# Patient Record
Sex: Female | Born: 1965 | Race: White | Hispanic: No | Marital: Married | Smoking: Never smoker
Health system: Southern US, Community
[De-identification: ages and names within clinical notes are randomized; demographics above are authoritative.]

## PROBLEM LIST (undated history)

## (undated) DIAGNOSIS — M47812 Spondylosis without myelopathy or radiculopathy, cervical region: Secondary | ICD-10-CM

## (undated) DIAGNOSIS — Z7982 Long term (current) use of aspirin: Secondary | ICD-10-CM

## (undated) DIAGNOSIS — E119 Type 2 diabetes mellitus without complications: Secondary | ICD-10-CM

## (undated) DIAGNOSIS — G5600 Carpal tunnel syndrome, unspecified upper limb: Secondary | ICD-10-CM

## (undated) DIAGNOSIS — Z79899 Other long term (current) drug therapy: Secondary | ICD-10-CM

## (undated) DIAGNOSIS — G43909 Migraine, unspecified, not intractable, without status migrainosus: Secondary | ICD-10-CM

## (undated) DIAGNOSIS — N950 Postmenopausal bleeding: Secondary | ICD-10-CM

## (undated) DIAGNOSIS — I2510 Atherosclerotic heart disease of native coronary artery without angina pectoris: Secondary | ICD-10-CM

## (undated) DIAGNOSIS — R42 Dizziness and giddiness: Secondary | ICD-10-CM

## (undated) DIAGNOSIS — D6851 Activated protein C resistance: Secondary | ICD-10-CM

## (undated) DIAGNOSIS — K219 Gastro-esophageal reflux disease without esophagitis: Secondary | ICD-10-CM

## (undated) DIAGNOSIS — I10 Essential (primary) hypertension: Secondary | ICD-10-CM

## (undated) HISTORY — PX: CHOLECYSTECTOMY: SHX55

## (undated) HISTORY — PX: CARPAL TUNNEL RELEASE: SHX101

## (undated) HISTORY — PX: ABDOMINAL SURGERY: SHX537

## (undated) HISTORY — DX: Activated protein C resistance: D68.51

---

## 2009-02-02 ENCOUNTER — Emergency Department: Payer: Self-pay | Admitting: Emergency Medicine

## 2009-05-13 ENCOUNTER — Ambulatory Visit: Payer: Self-pay | Admitting: Internal Medicine

## 2009-05-21 ENCOUNTER — Ambulatory Visit: Payer: Self-pay | Admitting: Internal Medicine

## 2009-12-03 ENCOUNTER — Ambulatory Visit: Payer: Self-pay | Admitting: Gastroenterology

## 2010-01-29 ENCOUNTER — Ambulatory Visit: Payer: Self-pay | Admitting: Gastroenterology

## 2010-02-04 ENCOUNTER — Ambulatory Visit: Payer: Self-pay | Admitting: Gastroenterology

## 2010-02-28 ENCOUNTER — Ambulatory Visit: Payer: Self-pay | Admitting: Surgery

## 2010-03-06 ENCOUNTER — Ambulatory Visit: Payer: Self-pay | Admitting: Surgery

## 2010-07-29 ENCOUNTER — Ambulatory Visit: Payer: Self-pay | Admitting: Internal Medicine

## 2010-08-24 ENCOUNTER — Ambulatory Visit: Payer: Self-pay | Admitting: Internal Medicine

## 2010-09-24 ENCOUNTER — Ambulatory Visit: Payer: Self-pay | Admitting: Internal Medicine

## 2011-05-13 ENCOUNTER — Ambulatory Visit: Payer: Self-pay | Admitting: Otolaryngology

## 2012-01-15 IMAGING — US ABDOMEN ULTRASOUND LIMITED
1 series · 17 of 25 positions shown · non-contrast
Comparison: none

REASON FOR EXAM: Abd Pain
COMMENTS:

[Series 1: abdomen ultrasound limited · 17 of 62 slices shown]
[im 1/62]
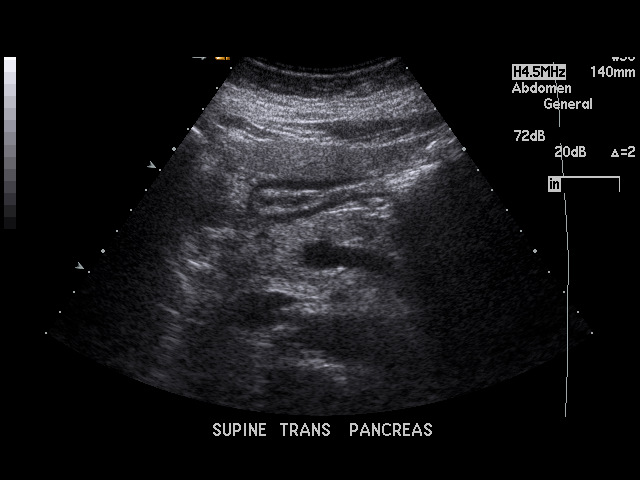
[im 6/62]
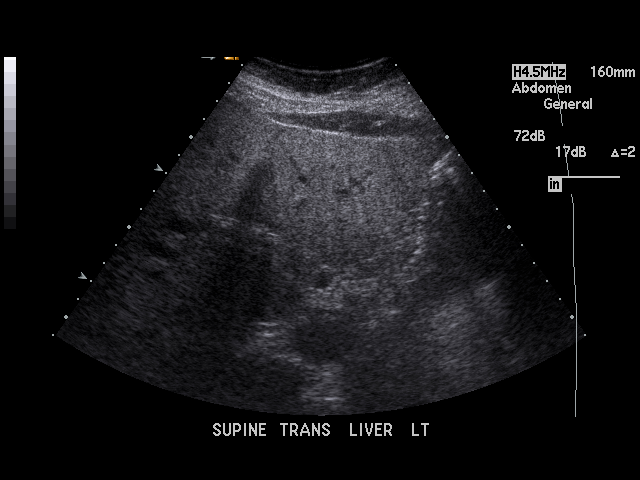
[im 8/62]
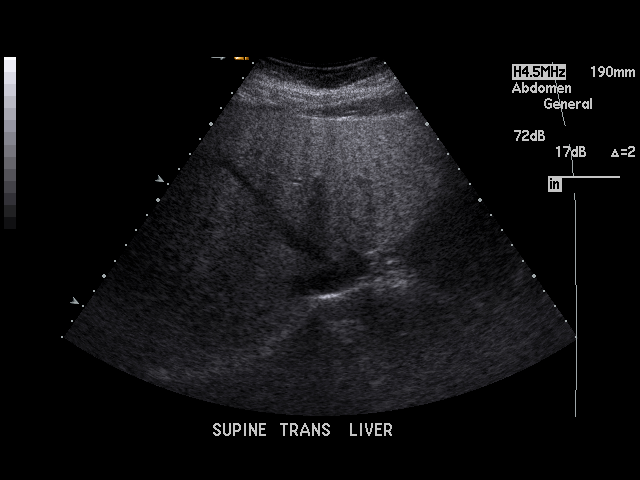
[im 13/62]
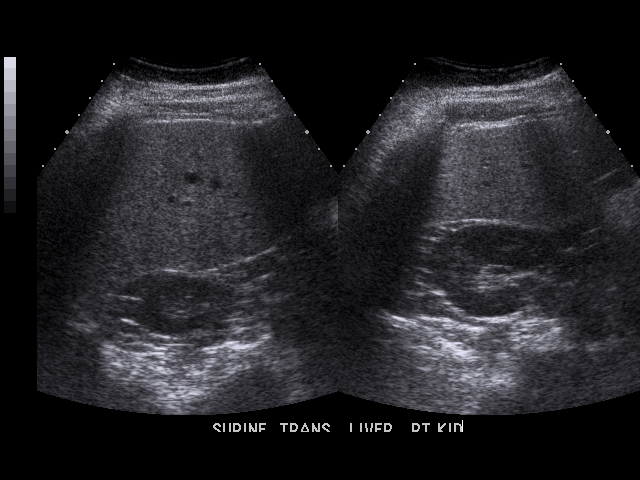
[im 16/62]
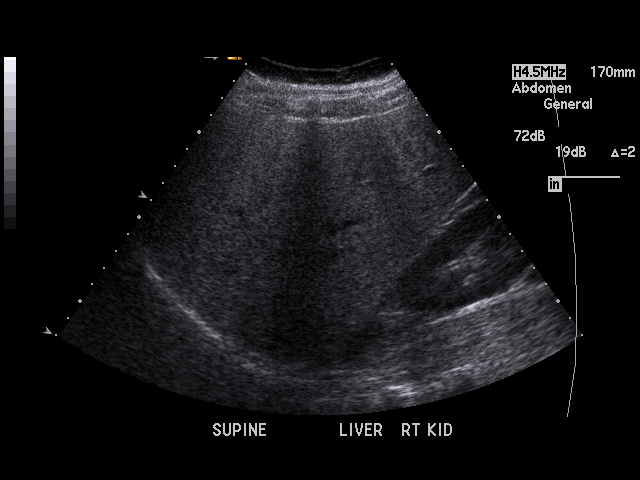
[im 21/62]
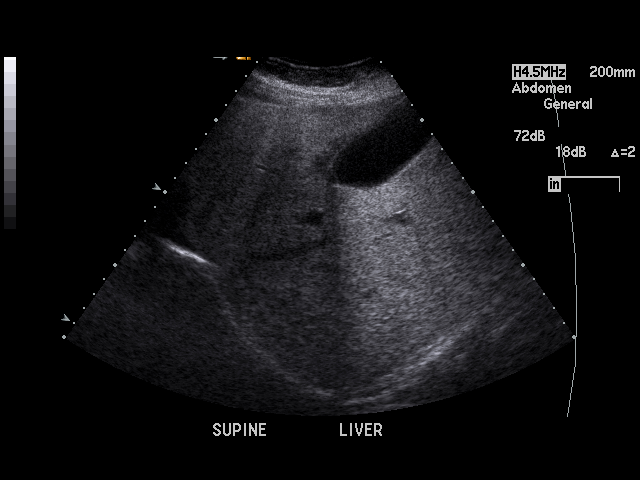
[im 23/62]
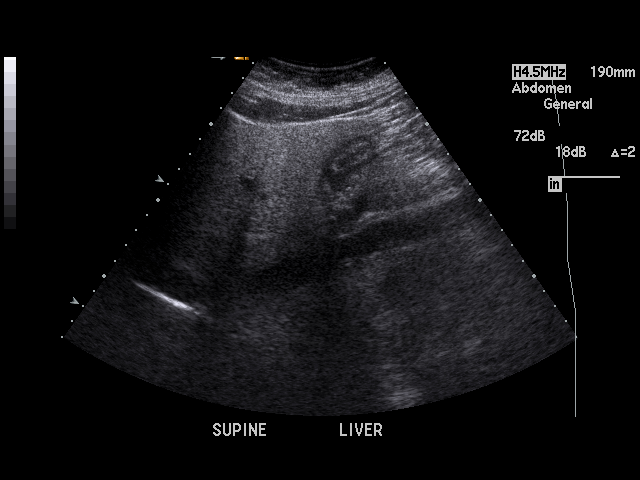
[im 28/62]
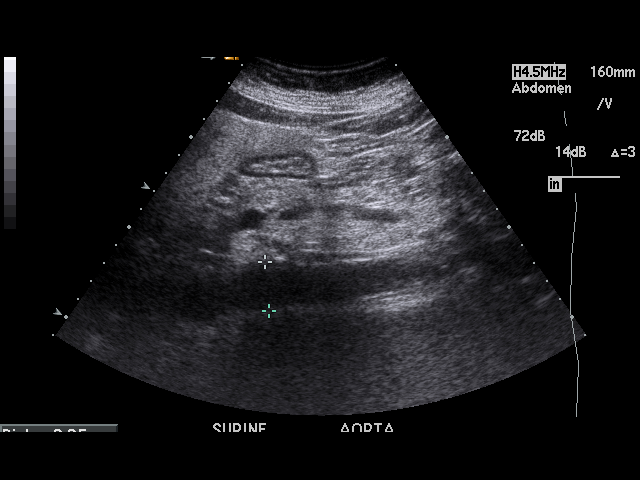
[im 31/62]
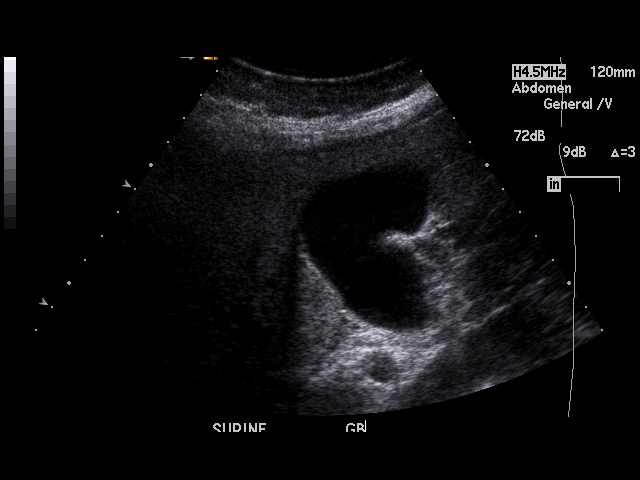
[im 34/62]
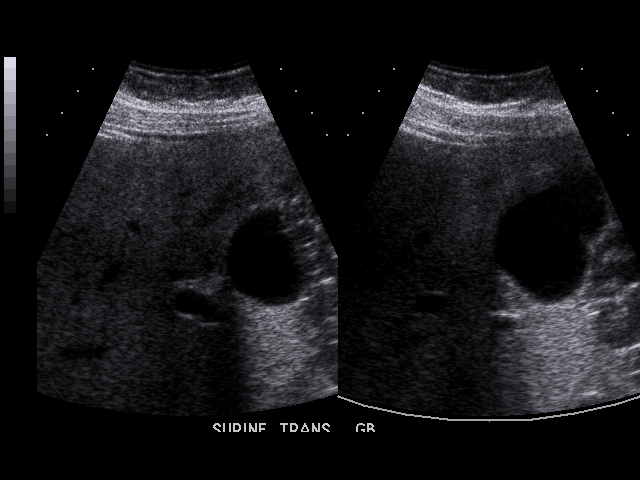
[im 39/62]
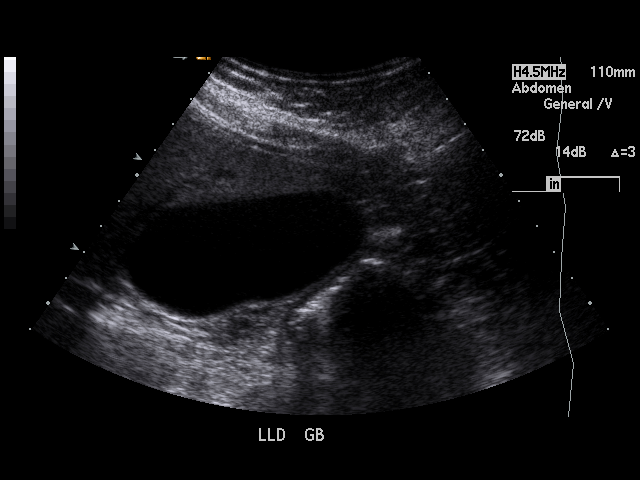
[im 41/62]
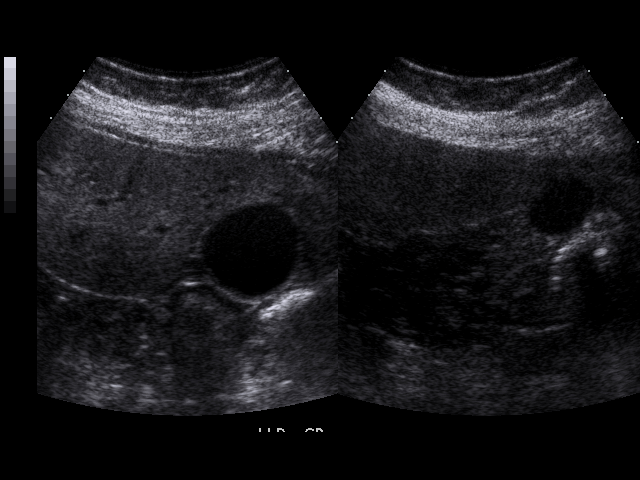
[im 46/62]
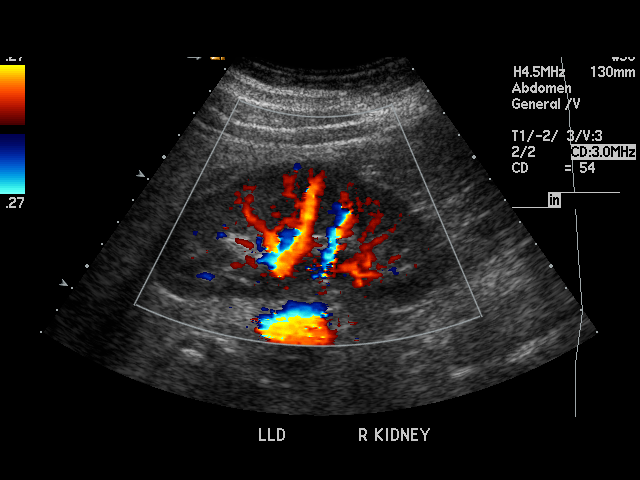
[im 49/62]
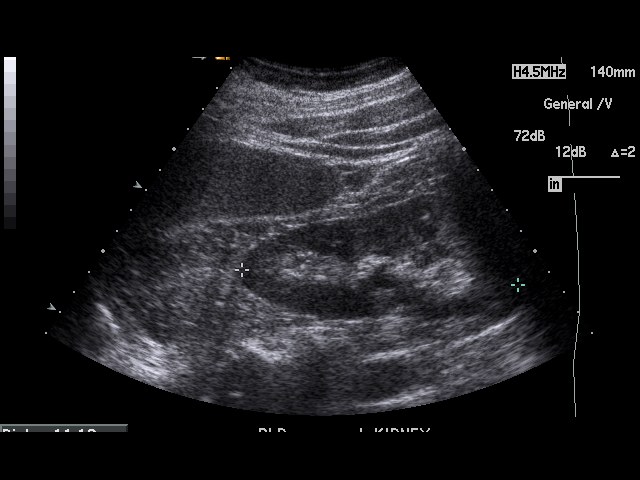
[im 54/62]
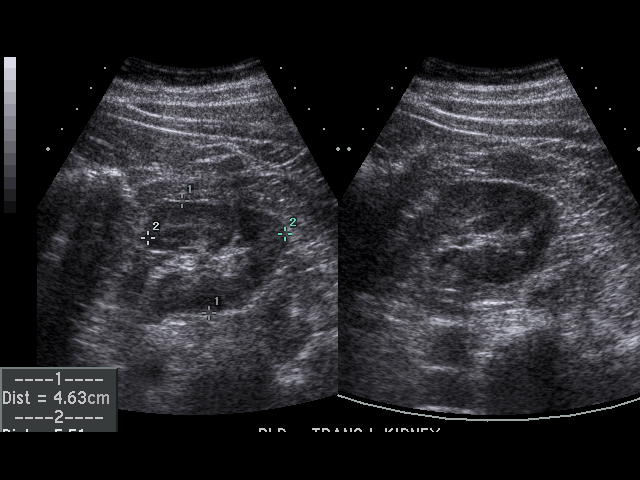
[im 56/62]
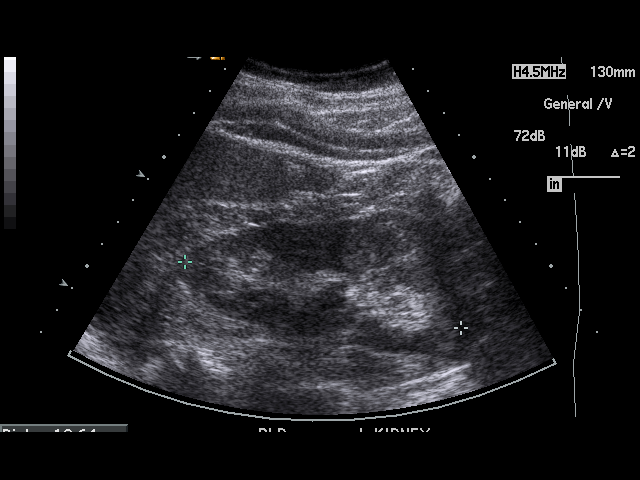
[im 62/62]
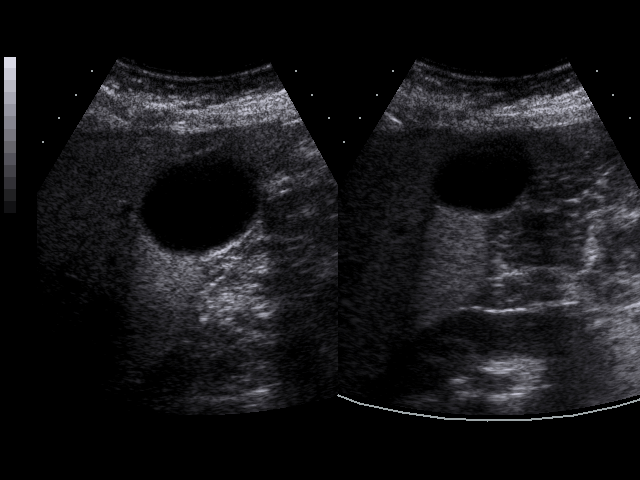

[17 of 25 positions shown; findings below may reference images not displayed]

PROCEDURE:     DONE - DONE ABDOMEN UPPER GENERAL  - January 29, 2010  [DATE]

RESULT:     The liver is hyperechogenic consistent with fatty infiltration.
No focal hepatic mass lesions are seen. The spleen size is within normal
limits. The pancreatic tail is obscured but the head and body of the
pancreas are visualized and are normal in appearance. The abdominal aorta
and inferior vena cava show no significant abnormalities. No gallstones are
seen. There is no thickening of the gallbladder wall. The common bile duct
measures 3.9 mm in diameter which is within normal limits. The kidneys show
no hydronephrosis. Sagittally, the right kidney measures 10.92 cm and the
left measures 11.12 cm. No ascites is seen.
IMPRESSION: 1.  Probable fatty infiltration of the liver.
2.  No gallstones or other acute changes identified.

## 2013-04-28 IMAGING — CT CT PARANASAL SINUSES W/O CM
1 series · 15 of 30 positions shown, 19 images · non-contrast
Comparison: none

REASON FOR EXAM: chronic pansinusitis
COMMENTS:

[Series 2: axial_supine · axial · 0.31mm/px · z∈[+655,+777]mm · 15 of 67 slices shown, 19 images]
[im 3/67  brain]
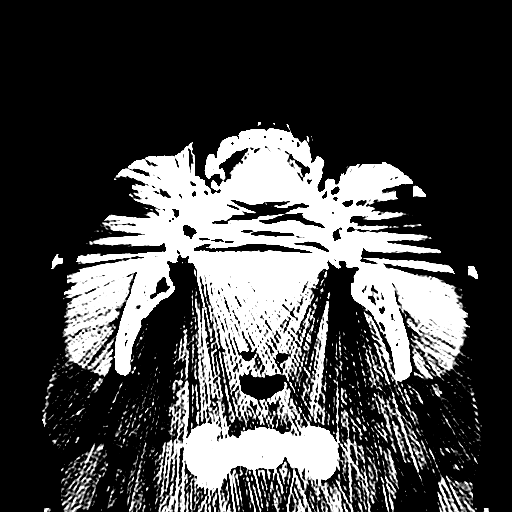
[im 3/67  bone]
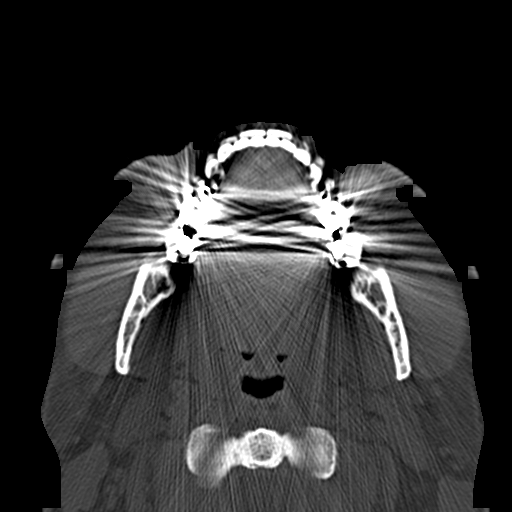
[im 7/67  bone]
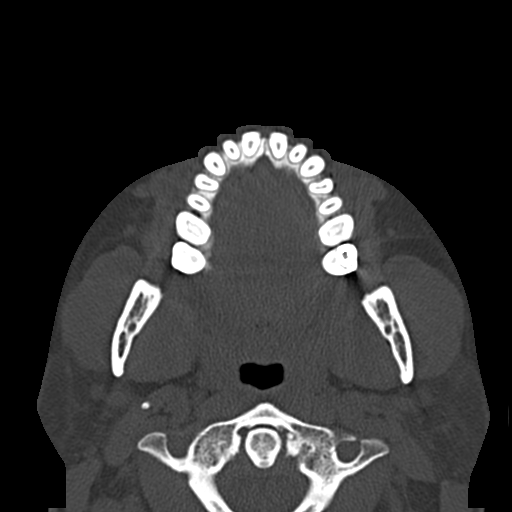
[im 12/67  bone]
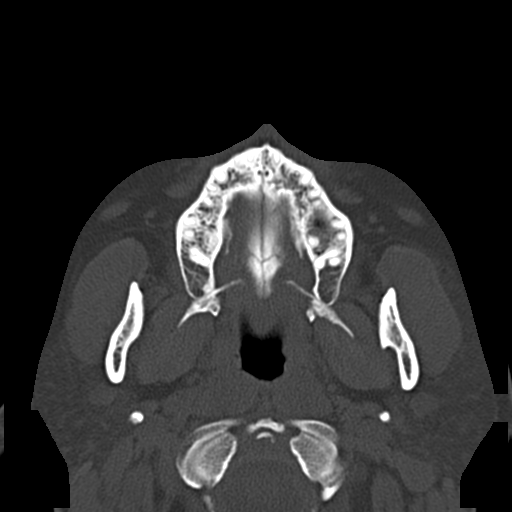
[im 16/67  bone]
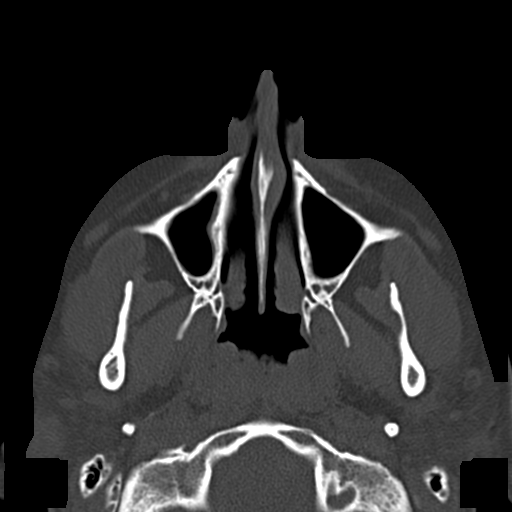
[im 21/67  brain]
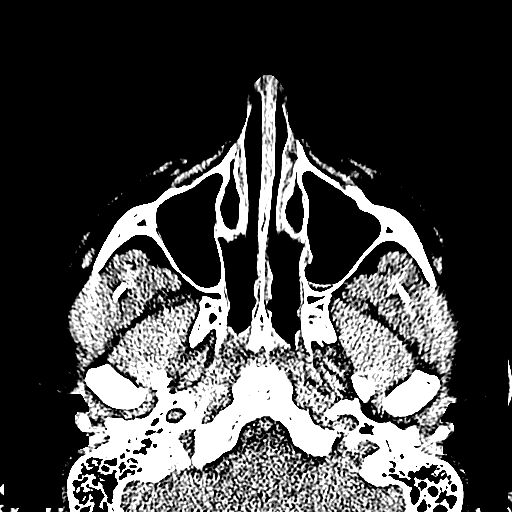
[im 21/67  bone]
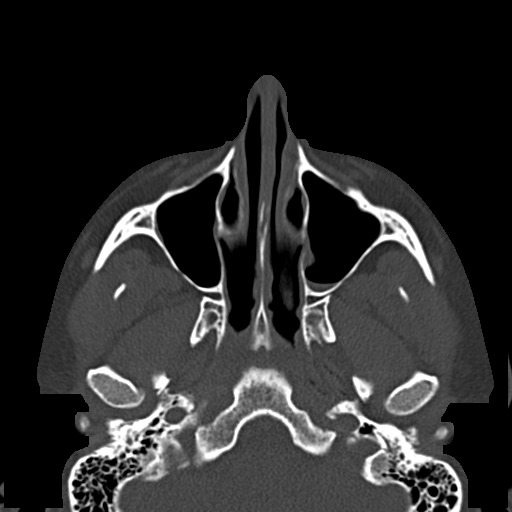
[im 26/67  bone]
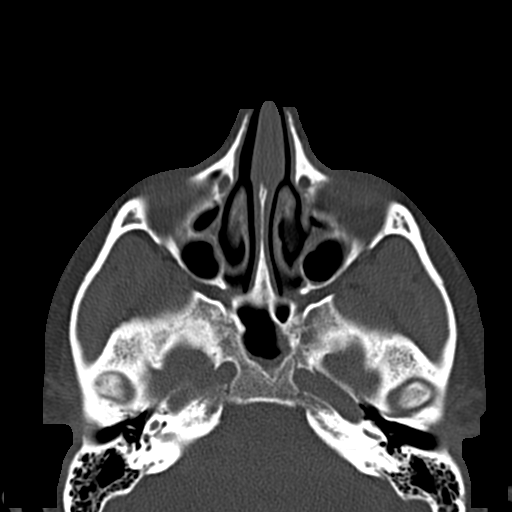
[im 30/67  bone]
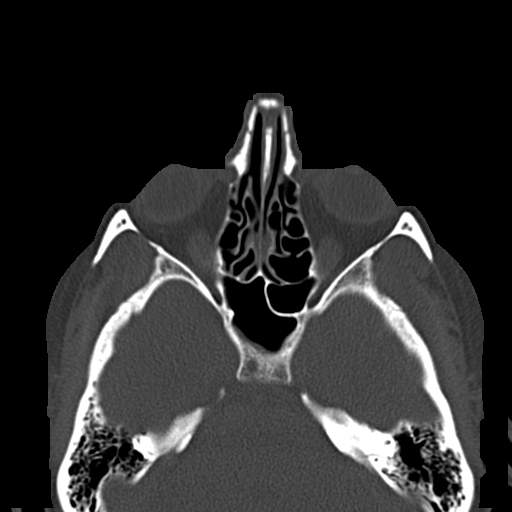
[im 35/67  bone]
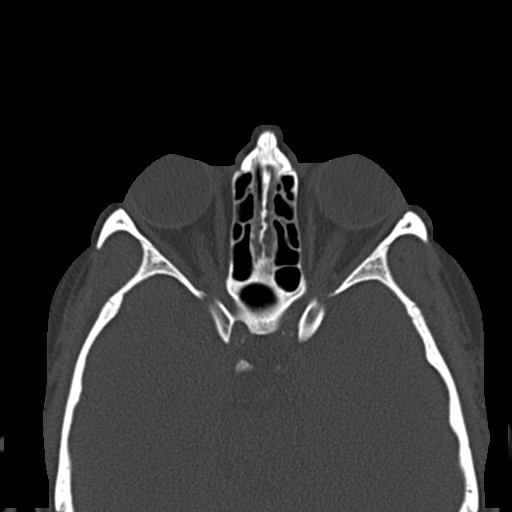
[im 37/67  brain]
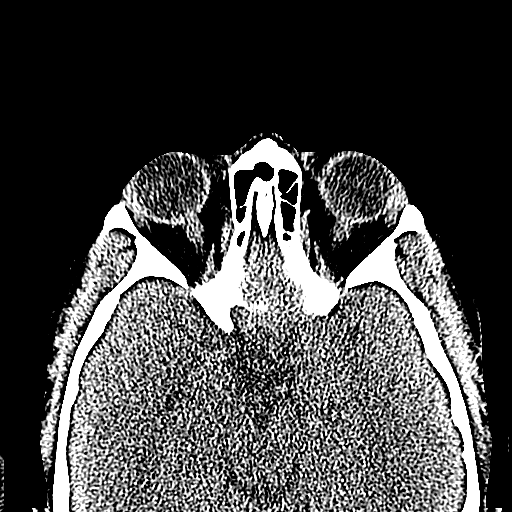
[im 37/67  bone]
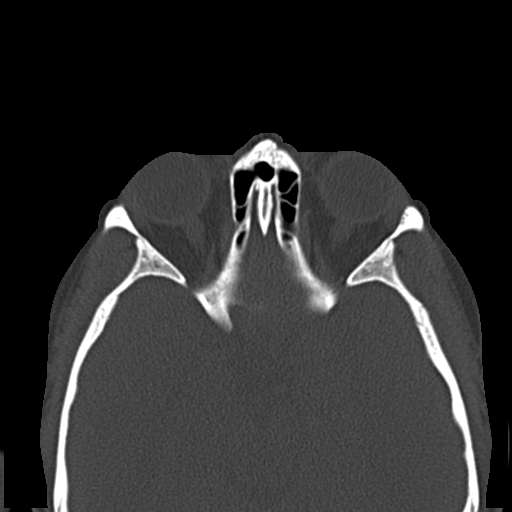
[im 41/67  bone]
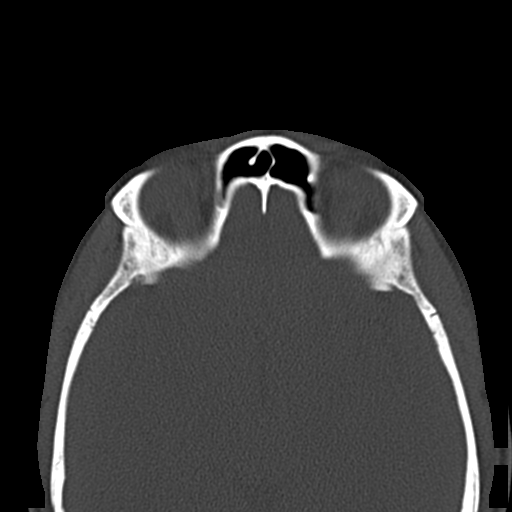
[im 46/67  bone]
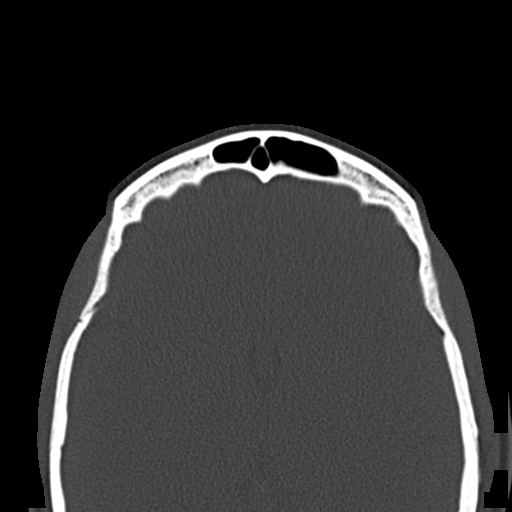
[im 51/67  bone]
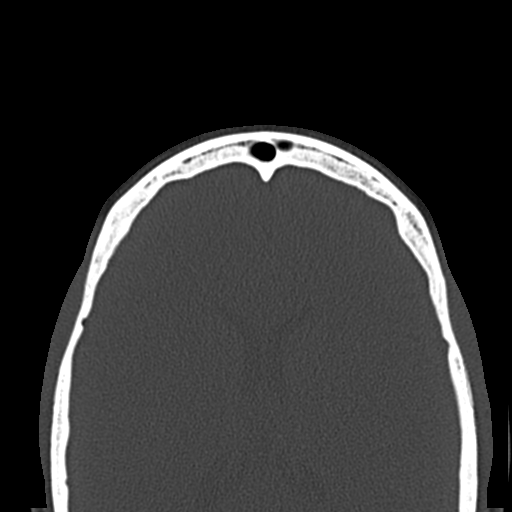
[im 55/67  brain]
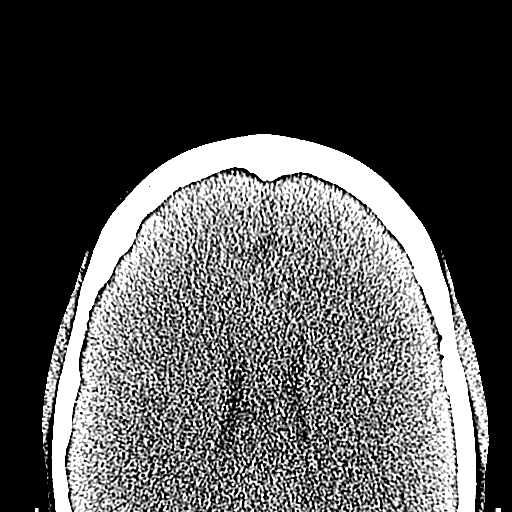
[im 55/67  bone]
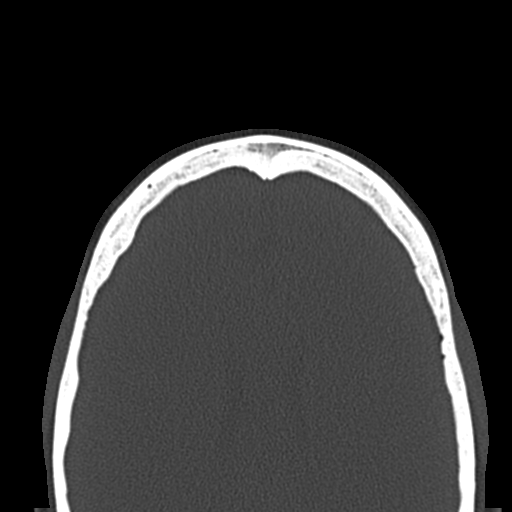
[im 60/67  bone]
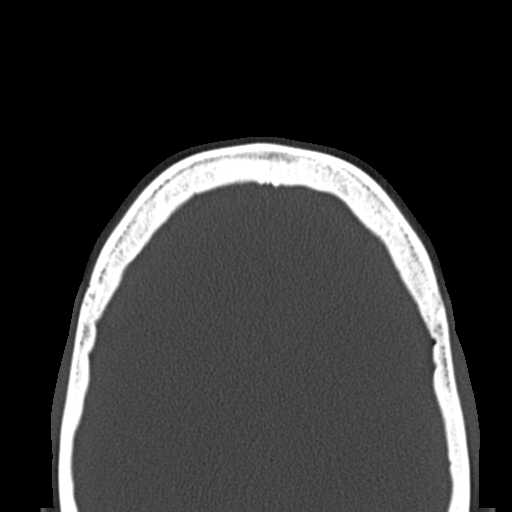
[im 64/67  bone]
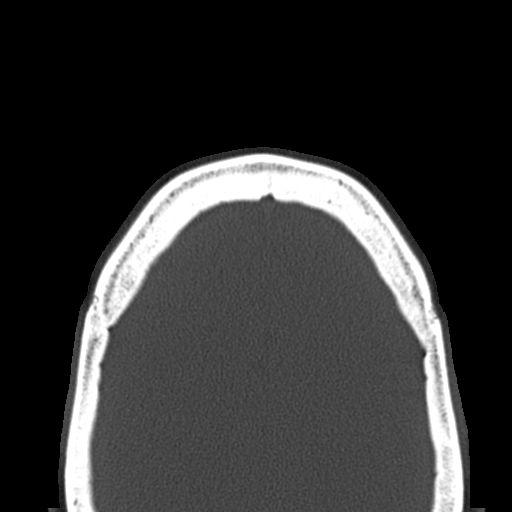

[15 of 30 positions shown; findings below may reference images not displayed]

PROCEDURE:     QUEENA - QUEENA SINUSES WITHOUT CONTRAST  - May 13, 2011  [DATE]

RESULT:     CT through the sinuses is reconstructed at bone window settings
in 2.0 mm slice thickness in the axial and coronal planes. There is no
previous CT for comparison.

The nasal septum approximates midline. The turbinates appear unremarkable.
There is no significant sinus opacification evident. No bony destruction is
appreciated. The orbits appear grossly normal. Minimal mucosal thickening is
seen within the left maxillary sinus possibly with a small mucous retention
cyst or polypoid lesion measuring 5.3 mm anterior to posterior across the
base on axial reconstruction 20.  A minimal amount of fluid may be present
posteriorly in the left maxillary sinus. Mucosal thickening versus minimal
fluid is noted in the right sphenoid sinus. There is partial opacification
posteriorly in the ethmoid air cells. The frontal sinuses are clear.
IMPRESSION: Minimal areas of fluid versus mucosal thickening in the left maxillary and
right sphenoid sinus with minimal opacification in the ethmoid air cells
posteriorly. Possible small polyp or mucous retention cyst along the medial
left maxillary wall.

## 2013-07-26 DIAGNOSIS — E1165 Type 2 diabetes mellitus with hyperglycemia: Secondary | ICD-10-CM | POA: Insufficient documentation

## 2013-07-26 DIAGNOSIS — J309 Allergic rhinitis, unspecified: Secondary | ICD-10-CM | POA: Insufficient documentation

## 2013-07-26 DIAGNOSIS — I10 Essential (primary) hypertension: Secondary | ICD-10-CM | POA: Insufficient documentation

## 2013-08-15 DIAGNOSIS — D682 Hereditary deficiency of other clotting factors: Secondary | ICD-10-CM | POA: Insufficient documentation

## 2013-09-07 ENCOUNTER — Ambulatory Visit: Payer: Self-pay | Admitting: Family Medicine

## 2013-11-23 DIAGNOSIS — F418 Other specified anxiety disorders: Secondary | ICD-10-CM | POA: Insufficient documentation

## 2014-08-31 ENCOUNTER — Emergency Department
Admission: EM | Admit: 2014-08-31 | Discharge: 2014-08-31 | Disposition: A | Discharge status: Home / Self Care | Payer: Self-pay | Attending: Emergency Medicine | Admitting: Emergency Medicine

## 2014-08-31 ENCOUNTER — Encounter: Payer: Self-pay | Admitting: Emergency Medicine

## 2014-08-31 DIAGNOSIS — I10 Essential (primary) hypertension: Secondary | ICD-10-CM | POA: Insufficient documentation

## 2014-08-31 DIAGNOSIS — E119 Type 2 diabetes mellitus without complications: Secondary | ICD-10-CM | POA: Insufficient documentation

## 2014-08-31 DIAGNOSIS — Z880 Allergy status to penicillin: Secondary | ICD-10-CM | POA: Insufficient documentation

## 2014-08-31 DIAGNOSIS — K047 Periapical abscess without sinus: Secondary | ICD-10-CM | POA: Insufficient documentation

## 2014-08-31 HISTORY — DX: Type 2 diabetes mellitus without complications: E11.9

## 2014-08-31 HISTORY — DX: Essential (primary) hypertension: I10

## 2014-08-31 MED ORDER — CLINDAMYCIN HCL 150 MG PO CAPS: 300.0000 mg | ORAL_CAPSULE | Freq: Three times a day (TID) | ORAL | Status: DC

## 2014-08-31 MED ORDER — KETOROLAC TROMETHAMINE 10 MG PO TABS: 10.0000 mg | ORAL_TABLET | Freq: Four times a day (QID) | ORAL | Status: DC | PRN

## 2014-08-31 NOTE — Discharge Instructions (Signed)
Dental Abscess °A dental abscess is a collection of infected fluid (pus) from a bacterial infection in the inner part of the tooth (pulp). It usually occurs at the end of the tooth's root.  °CAUSES  °· Severe tooth decay. °· Trauma to the tooth that allows bacteria to enter into the pulp, such as a broken or chipped tooth. °SYMPTOMS  °· Severe pain in and around the infected tooth. °· Swelling and redness around the abscessed tooth or in the mouth or face. °· Tenderness. °· Pus drainage. °· Bad breath. °· Bitter taste in the mouth. °· Difficulty swallowing. °· Difficulty opening the mouth. °· Nausea. °· Vomiting. °· Chills. °· Swollen neck glands. °DIAGNOSIS  °· A medical and dental history will be taken. °· An examination will be performed by tapping on the abscessed tooth. °· X-rays may be taken of the tooth to identify the abscess. °TREATMENT °The goal of treatment is to eliminate the infection. You may be prescribed antibiotic medicine to stop the infection from spreading. A root canal may be performed to save the tooth. If the tooth cannot be saved, it may be pulled (extracted) and the abscess may be drained.  °HOME CARE INSTRUCTIONS °· Only take over-the-counter or prescription medicines for pain, fever, or discomfort as directed by your caregiver. °· Rinse your mouth (gargle) often with salt water (¼ tsp salt in 8 oz [250 ml] of warm water) to relieve pain or swelling. °· Do not drive after taking pain medicine (narcotics). °· Do not apply heat to the outside of your face. °· Return to your dentist for further treatment as directed. °SEEK MEDICAL CARE IF: °· Your pain is not helped by medicine. °· Your pain is getting worse instead of better. °SEEK IMMEDIATE MEDICAL CARE IF: °· You have a fever or persistent symptoms for more than 2-3 days. °· You have a fever and your symptoms suddenly get worse. °· You have chills or a very bad headache. °· You have problems breathing or swallowing. °· You have trouble  opening your mouth. °· You have swelling in the neck or around the eye. °Document Released: 02/09/2005 Document Revised: 11/04/2011 Document Reviewed: 05/20/2010 °ExitCare® Patient Information ©2015 [employer]. This information is not intended to replace advice given to you by your health care provider. Make sure you discuss any questions you have with your health care provider. ° ° °OPTIONS FOR DENTAL FOLLOW UP CARE ° °[place] Department of Health and Human Services - Local Safety Net Dental Clinics °http://www.ncdhhs.gov/dph/oralhealth/services/safetynetclinics.htm °  °[facility] (336-562-3123) ° °[facility] (919-933-9087) ° °[facility] (919-663-1744 ext 237) ° °[facility] (336-570-6415) ° °[facility] (919-968-2025) °This clinic caters to the indigent population and is on a lottery system. °Location: °[facility], [address], [place] °Clinic Hours: °Wednesdays from 6pm - 9pm, patients seen by a lottery system. °For dates, call or go to www.med.[facility].edu/[facility]/patients/Dental-[facility] °Services: °Cleanings, fillings and simple extractions. °Payment Options: °DENTAL WORK IS FREE OF CHARGE. Bring proof of income or support. °Best way to get seen: °Arrive at 5:15 pm - this is a lottery, NOT first come/first serve, so arriving earlier will not increase your chances of being seen. °  °  °[facility] °919-537-3737 °Select option 1 for emergencies °  °Location: °[facility], [address], [place] °Clinic Hours: °No walk-ins accepted - call the day before to schedule an appointment. °Check in times   are 9:30 am and 1:30 pm. °Services: °Simple extractions, temporary fillings, pulpectomy/pulp debridement, uncomplicated abscess drainage. °Payment Options: °PAYMENT IS DUE AT THE TIME OF SERVICE.  Fee is usually $100-200, additional surgical procedures (e.g. abscess drainage) may  be extra. °Cash, checks, Visa/MasterCard accepted.  Can file Medicaid if patient is covered for dental - patient should call case worker to check. °No discount for [facility] patients. °Best way to get seen: °MUST call the day before and get onto the schedule. Can usually be seen the next 1-2 days. No walk-ins accepted. °  °  °[facility] °919-933-9087 °  °Location: °[facility], [address], [place] °Clinic Hours: °M, W, Th, F 8am or 1:30pm, Tues 9a or 1:30 - first come/first served. °Services: °Simple extractions, temporary fillings, uncomplicated abscess drainage.  You do not need to be an [place] resident. °Payment Options: °PAYMENT IS DUE AT THE TIME OF SERVICE. °Dental insurance, otherwise sliding scale - bring proof of income or support. °Depending on income and treatment needed, cost is usually $50-200. °Best way to get seen: °Arrive early as it is first come/first served. °  °  °[facility] °919-542-1641 °  °Location: °[address] °Clinic Hours: °Mon-Thu 8a-5p °Services: °Most basic dental services including extractions and fillings. °Payment Options: °PAYMENT IS DUE AT THE TIME OF SERVICE. °Sliding scale, up to 50% off - bring proof if income or support. °Medicaid with dental option accepted. °Best way to get seen: °Call to schedule an appointment, can usually be seen within 2 weeks OR they will try to see walk-ins - show up at 8a or 2p (you may have to wait). °  °  °[facility] °919-245-2435 °[place] RESIDENTS ONLY °  °Location: °[facility], [address], [place], [place] [address] °Clinic Hours: By appointment only. °Monday - Thursday 8am-5pm, Friday 8am-12pm °Services: Cleanings, fillings, extractions. °Payment Options: °PAYMENT IS DUE AT THE TIME OF SERVICE. °Cash, Visa or MasterCard. Sliding scale - $30 minimum per service. °Best way to get seen: °Come in to  office, complete packet and make an appointment - need proof of income °or support monies for each household member and proof of [place] residence. °Usually takes about a month to get in. °  °  °[facility] °[phone] °  °Location: °[address] [place] °Clinic Hours: Walk-in Urgent Care Dental Services are offered Monday-Friday mornings only. °The numbers of emergencies accepted daily is limited to the number of °providers available. °Maximum 15 - Mondays, Wednesdays & Thursdays °Maximum 10 - Tuesdays & Fridays °Services: °You do not need to be a [place] resident to be seen for a dental emergency. °Emergencies are defined as pain, swelling, abnormal bleeding, or dental trauma. Walkins will receive x-rays if needed. °NOTE: Dental cleaning is not an emergency. °Payment Options: °PAYMENT IS DUE AT THE TIME OF SERVICE. °Minimum co-pay is $40.00 for uninsured patients. °Minimum co-pay is $3.00 for Medicaid with dental coverage. °Dental Insurance is accepted and must be presented at time of visit. °Medicare does not cover dental. °Forms of payment: Cash, credit card, checks. °Best way to get seen: °If not previously registered with the clinic, walk-in dental registration begins at 7:15 am and is on a first come/first serve basis. °If previously registered with the clinic, call to make an appointment. °  °  °[facility] °919-776-4359 °[place] RESIDENTS ONLY °  °Location: °[address],   [place], [place] °Clinic Hours: °Mon-Thu 10a-2p °Services: Extractions only! °Payment Options: °FREE (donations accepted) - bring proof of income or support °Best way to get seen: °Call and schedule an appointment OR come at 8am on the 1st Monday of every month (except for holidays) when it is first come/first served. °  °  °[facility] °[phone] °  °Location: °[address], [place] °Clinic Hours: °Friday mornings °Services, Payment Options, Best way to get  seen: °Call for info °

## 2014-08-31 NOTE — ED Provider Notes (Signed)
[facility] Emergency Department Provider Note  ____________________________________________  Time seen: 1458  I have reviewed the triage vital signs and the nursing notes.   HISTORY  Chief Complaint Dental Pain   HPI Erica Ayala is a 48 y.o. female is here with complaint of constant left upper dental pain for 6 days. Patient states she thought she had a sinus infection and was seen in [place]. She was placed on Levaquin for infection but this has not helped. She still continues to have dental pain. Currently she does not have a dentist. While she is in [place] she did have a x-ray and was told that possibly the route to her tooth is "dying". They he will be able to see her next month. She is taken over-the-counter medication without any improvement. Currently her pain is 7/10.   Past Medical History  Diagnosis Date  . Diabetes mellitus without complication   . Hypertension     There are no active problems to display for this patient.   Past Surgical History  Procedure Laterality Date  . Abdominal surgery    . Cholecystectomy      Current Outpatient Rx  Name  Route  Sig  Dispense  Refill  . clindamycin (CLEOCIN) 150 MG capsule   Oral   Take 2 capsules (300 mg total) by mouth 3 (three) times daily.   60 capsule   0   . ketorolac (TORADOL) 10 MG tablet   Oral   Take 1 tablet (10 mg total) by mouth every 6 (six) hours as needed.   12 tablet   0     Allergies Biaxin; Metformin and related; Penicillins; and Sulfa antibiotics  No family history on file.  Social History History  Substance Use Topics  . Smoking status: Never Smoker   . Smokeless tobacco: Not on file  . Alcohol Use: No    Review of Systems Constitutional: No fever/chills ENT: No sore throat. Cardiovascular: Denies chest pain. Respiratory: Denies shortness of breath. Gastrointestinal: No abdominal pain.  No nausea, no vomiting.. Genitourinary: Negative for  dysuria. Musculoskeletal: Negative for back pain. Skin: Negative for rash. Neurological: Negative for headaches 10-point ROS otherwise negative.  ____________________________________________   PHYSICAL EXAM:  VITAL SIGNS: ED Triage Vitals  Enc Vitals Group     BP 08/31/14 1334 112/76 mmHg     Pulse Rate 08/31/14 1334 71     Resp 08/31/14 1334 18     Temp 08/31/14 1334 98.4 F (36.9 C)     Temp Source 08/31/14 1334 Oral     SpO2 08/31/14 1334 98 %     Weight 08/31/14 1334 188 lb (85.276 kg)     Height 08/31/14 1334  (1.6 m)     Head Cir --      Peak Flow --      Pain Score 08/31/14 1335 7     Pain Loc --      Pain Edu? --      Excl. in GC? --     Constitutional: Alert and oriented. Well appearing and in no acute distress. Eyes: Conjunctivae are normal. PERRL. EOMI. Head: Atraumatic. Nose: No congestion/rhinnorhea. Mouth/Throat: Mucous membranes are moist.  Oropharynx non-erythematous. Left upper molars with tenderness of the gums especially around # 13-14.   Neck: No stridor.  Supple Hematological/Lymphatic/Immunilogical: No cervical lymphadenopathy. Cardiovascular: Normal rate, regular rhythm. Grossly normal heart sounds.  Good peripheral circulation. Respiratory: Normal respiratory effort.  No retractions. Lungs CTAB. Gastrointestinal: Soft  and nontender. No distention. Musculoskeletal: No lower extremity tenderness nor edema.  No joint effusions. Neurologic:  Normal speech and language. No gross focal neurologic deficits are appreciated. Speech is normal. No gait instability. Skin:  Skin is warm, dry and intact. No rash noted. Psychiatric: Mood and affect are normal. Speech and behavior are normal.  ____________________________________________   LABS (all labs ordered are listed, but only abnormal results are displayed)  Labs Reviewed - No data to display   PROCEDURES  Procedure(s) performed: None  Critical Care performed:  No  ____________________________________________   INITIAL IMPRESSION / ASSESSMENT AND PLAN / ED COURSE  Pertinent labs & imaging results that were available during my care of the patient were reviewed by me and considered in my medical decision making (see chart for details).  Patient is to stop Levaquin and start clindamycin. She was given a coupon for good Rx for the clindamycin. She is also written a prescription for Toradol 10 mg one every 6 hours when necessary pain and inflammation. [place] or she was given a list of the dental options that she has. ____________________________________________   FINAL CLINICAL IMPRESSION(S) / ED DIAGNOSES  Final diagnoses:  Dental infection      Tommi RumpsRhonda L Danialle Dement, PA-C 08/31/14 1532  Minna AntisKevin Paduchowski, MD 09/01/14 [phone]

## 2014-08-31 NOTE — ED Notes (Signed)
Pt with left sided dental pain since Sunday.

## 2014-08-31 NOTE — ED Notes (Signed)
Left upper toothache

## 2015-03-21 ENCOUNTER — Other Ambulatory Visit: Payer: Self-pay | Admitting: Family Medicine

## 2015-03-21 DIAGNOSIS — R1011 Right upper quadrant pain: Secondary | ICD-10-CM

## 2015-03-27 ENCOUNTER — Ambulatory Visit: Payer: BC Managed Care – PPO

## 2015-08-24 IMAGING — MG MM DIGITAL SCREENING BILAT W/ CAD
1 series · 5 of 5 positions shown · non-contrast
Comparison: Previous exam(s).

CLINICAL DATA: Screening.

EXAM:
DIGITAL SCREENING BILATERAL MAMMOGRAM WITH CAD

[R CC · right · 5 of 5 slices shown]
[im 1/5]
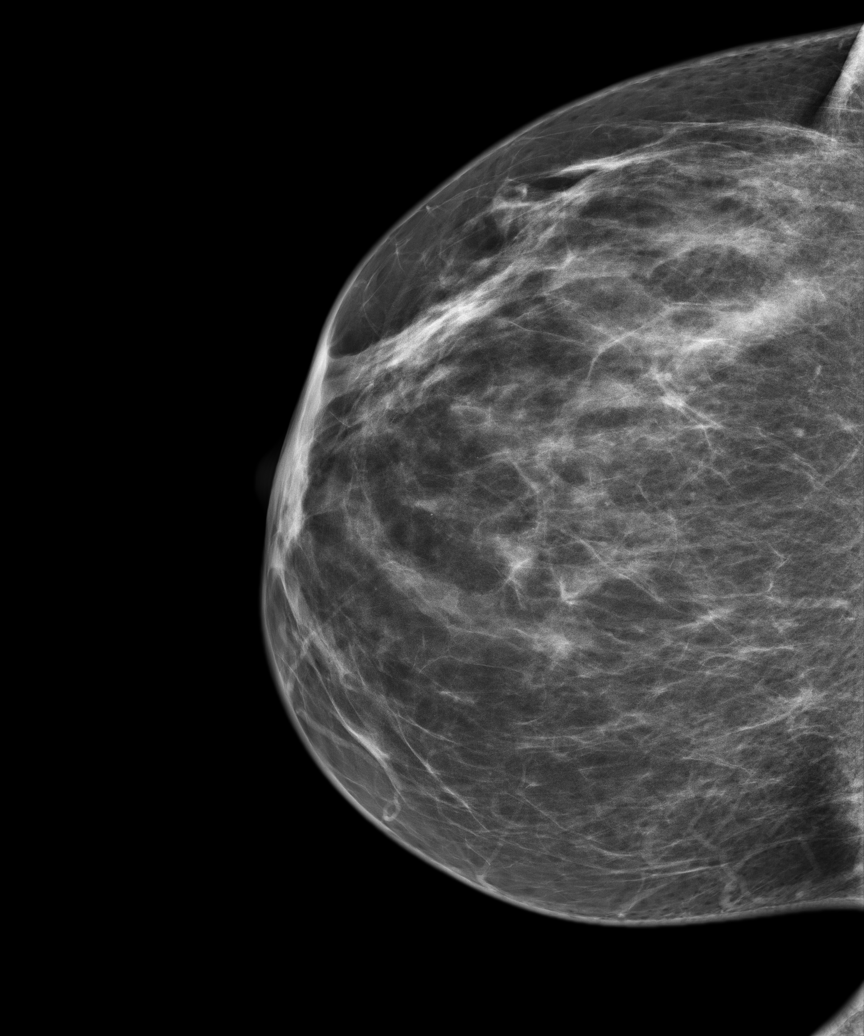
[im 2/5]
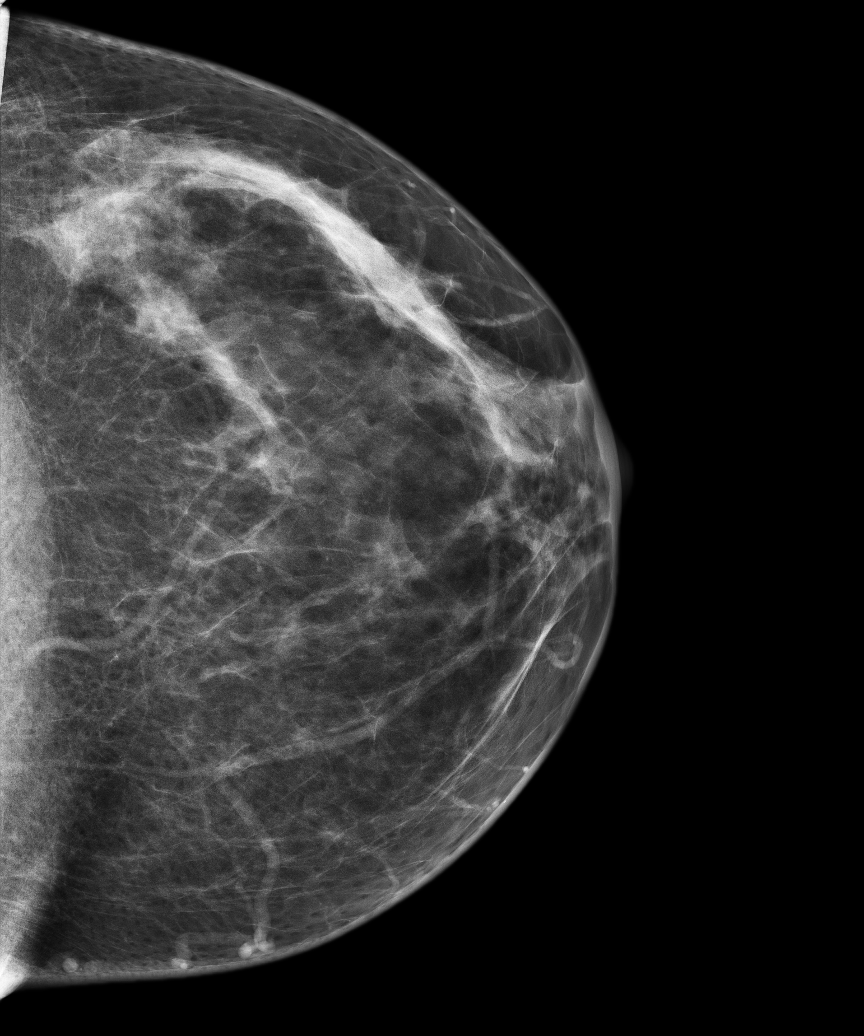
[im 3/5]
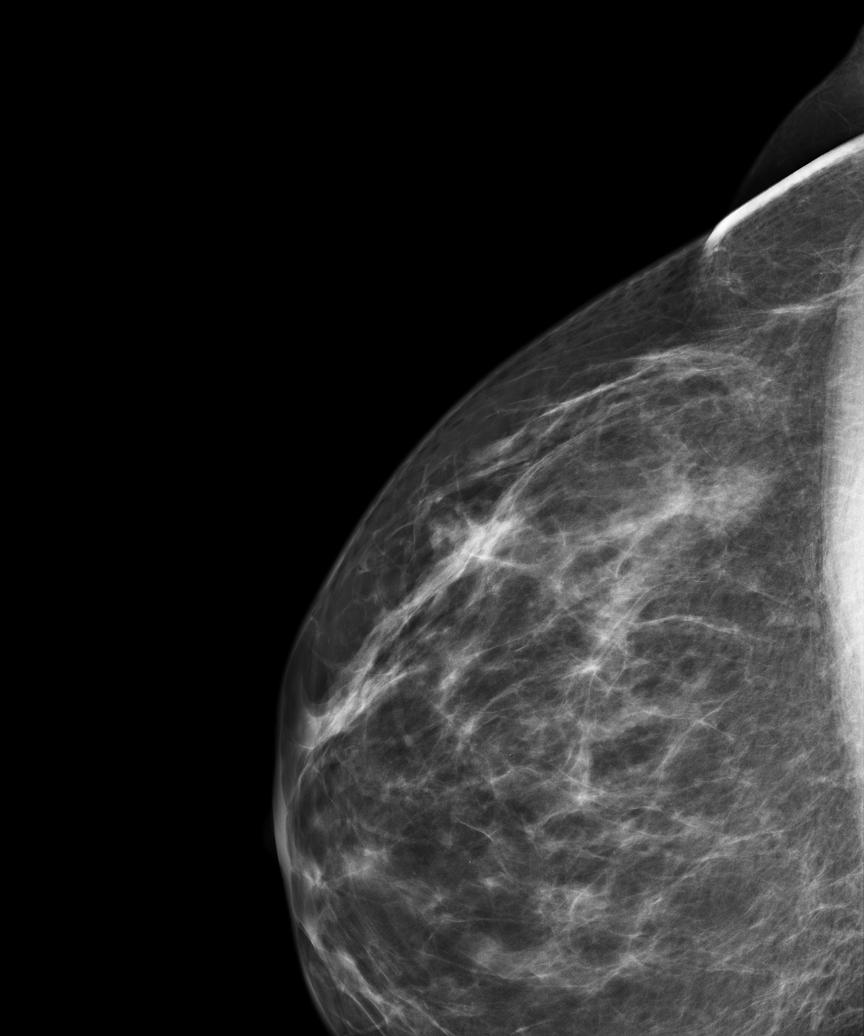
[im 4/5]
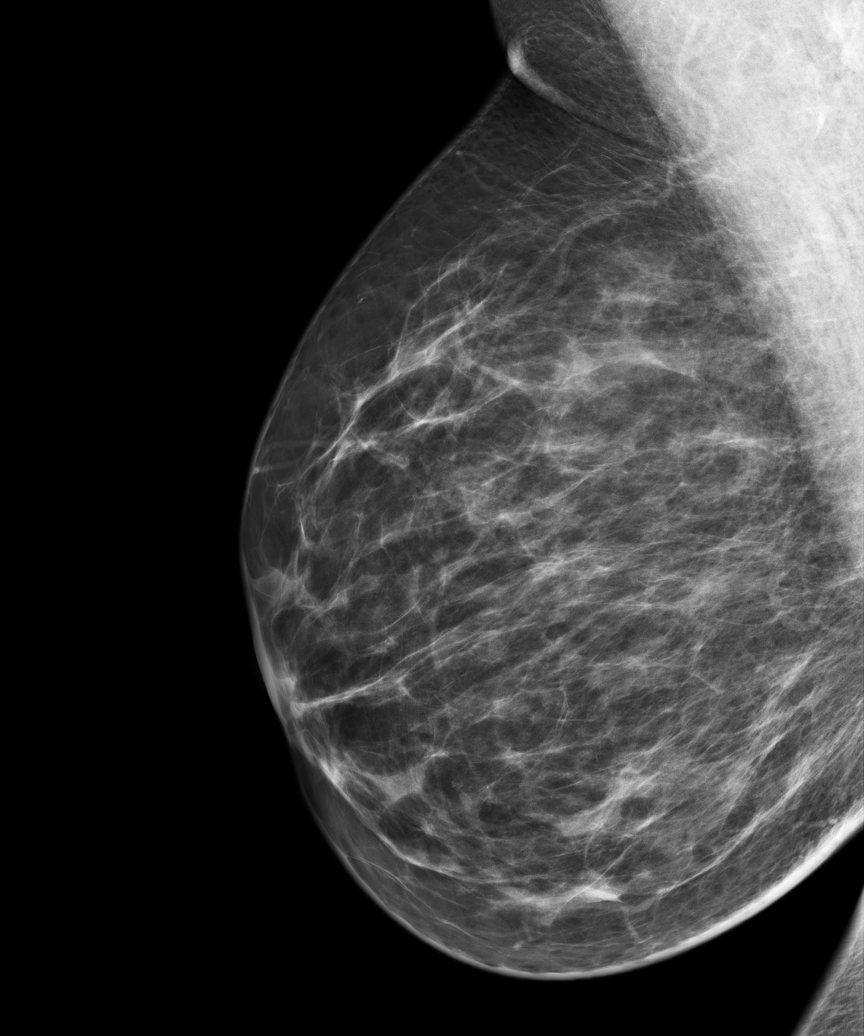
[im 5/5]
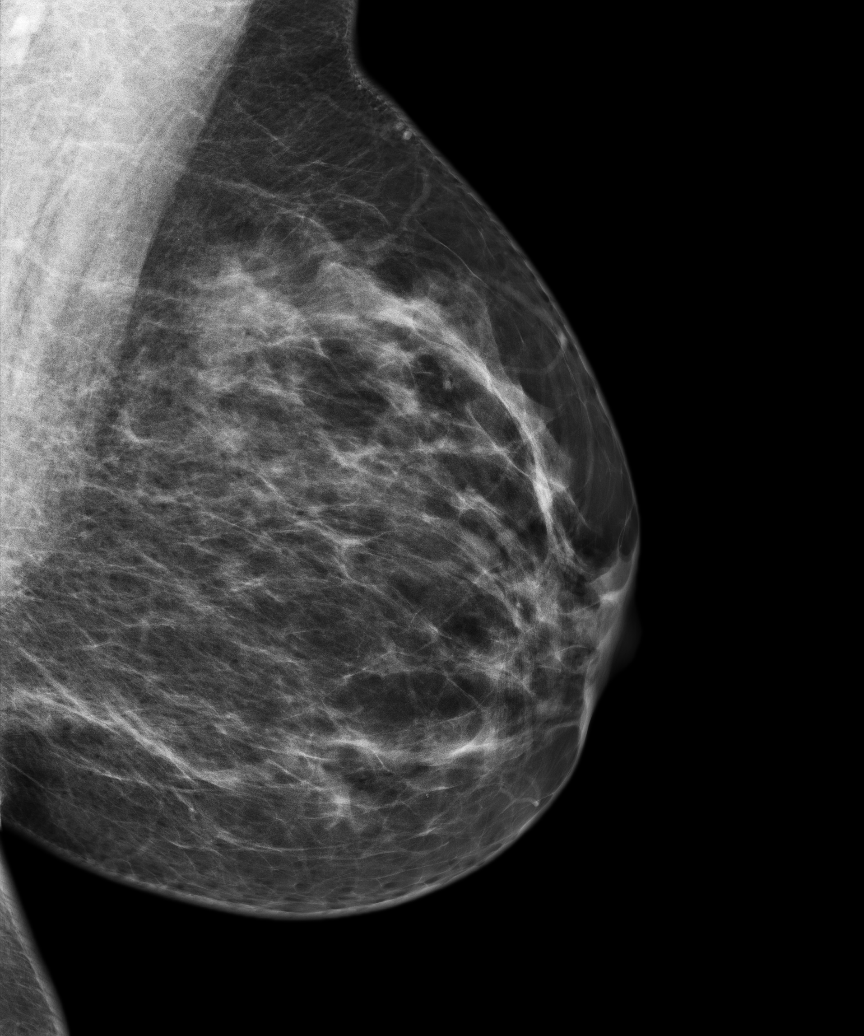

[5 of 5 positions shown; findings below may reference images not displayed]

ACR Breast Density Category b: There are scattered areas of
fibroglandular density.
FINDINGS: There are no findings suspicious for malignancy. Images were
processed with CAD.
IMPRESSION: No mammographic evidence of malignancy. A result letter of this
screening mammogram will be mailed directly to the patient.

RECOMMENDATION:
Screening mammogram in one year. (Code:AS-G-LCT)

BI-RADS CATEGORY  1: Negative.

## 2016-02-27 ENCOUNTER — Encounter: Payer: Self-pay | Admitting: *Deleted

## 2016-02-27 ENCOUNTER — Encounter: Payer: BC Managed Care – PPO | Attending: Family Medicine | Admitting: *Deleted

## 2016-02-27 VITALS — BP 110/72 | Ht 63.0 in | Wt 187.1 lb

## 2016-02-27 DIAGNOSIS — E119 Type 2 diabetes mellitus without complications: Secondary | ICD-10-CM | POA: Diagnosis not present

## 2016-02-27 DIAGNOSIS — Z713 Dietary counseling and surveillance: Secondary | ICD-10-CM | POA: Diagnosis not present

## 2016-02-27 DIAGNOSIS — E1165 Type 2 diabetes mellitus with hyperglycemia: Secondary | ICD-10-CM

## 2016-02-27 NOTE — Patient Instructions (Addendum)
Check blood sugars 2 x day before breakfast and 2 hrs after supper every day  Exercise:  Walk/run  for 20-30  minutes  5 days a week  Eat 3 meals day,  2-3  snacks a day Space meals 4-6 hours apart Limit fried foods  Make an eye doctor appointment  Bring blood sugar records to the next class  Return for classes on:

## 2016-02-27 NOTE — Progress Notes (Signed)
Diabetes Self-Management Education  Visit Type: First/Initial  Appt. Start Time: 1420 Appt. End Time 1540  02/27/2016  Ms. Erica Ayala, identified by name and date of birth, is a 51 y.o. female with a diagnosis of Diabetes: Type 2.   ASSESSMENT  Blood pressure 110/72, height 5\' 3"  (1.6 m), weight 187 lb 1.6 oz (84.9 kg). Body mass index is 33.14 kg/m.      Diabetes Self-Management Education - 02/27/16 1740      Visit Information   Visit Type First/Initial     Initial Visit   Diabetes Type Type 2   Are you currently following a meal plan? No   Are you taking your medications as prescribed? Yes   Date Diagnosed 4 years ago     Health Coping   How would you rate your overall health? Poor     Psychosocial Assessment   Patient Belief/Attitude about Diabetes Other (comment)  depressed   Self-care barriers None   Self-management support Doctor's office;Family   Other persons present Family Member  mother-in-law   Patient Concerns Nutrition/Meal planning;Medication;Glycemic Control;Monitoring;Weight Control;Healthy Lifestyle   Special Needs None   Preferred Learning Style Visual;Hands on   Learning Readiness Change in progress   How often do you need to have someone help you when you read instructions, pamphlets, or other written materials from your doctor or pharmacy? 1 - Never   What is the last grade level you completed in school? BS     Pre-Education Assessment   Patient understands the diabetes disease and treatment process. Needs Instruction   Patient understands incorporating nutritional management into lifestyle. Needs Instruction   Patient undertands incorporating physical activity into lifestyle. Needs Instruction   Patient understands using medications safely. Needs Instruction   Patient understands monitoring blood glucose, interpreting and using results Needs Review   Patient understands prevention, detection, and treatment of acute complications. Needs  Instruction   Patient understands prevention, detection, and treatment of chronic complications. Needs Review   Patient understands how to develop strategies to address psychosocial issues. Needs Instruction   Patient understands how to develop strategies to promote health/change behavior. Needs Instruction     Complications   Last HgB A1C per patient/outside source 7.9 %  01/23/16   How often do you check your blood sugar? 1-2 times/day   Fasting Blood glucose range (mg/dL) 161-096;045-409;>811  Pt reports FBG's 150's until Christmas and now as high as 230's mg/dL.    Postprandial Blood glucose range (mg/dL) >914  Pt reports pp's 280-300's mg/dL.    Have you had a dilated eye exam in the past 12 months? No   Have you had a dental exam in the past 12 months? No   Are you checking your feet? Yes   How many days per week are you checking your feet? 7     Dietary Intake   Breakfast cereal and milk   Snack (morning) protein powder in milk   Lunch egg salad sandwich, fruit, chicken salad, peanut butter crackers   Snack (afternoon) yogurt, peanut butter crackers   Dinner meat and vegetables - sweet potatoes, rice, non-starchy vegetables, spaghetti   Snack (evening) SF cookies, ice cream   Beverage(s) water, unsweetened tea and coffee     Exercise   Exercise Type Light (walking / raking leaves)   How many days per week to you exercise? 5   How many minutes per day do you exercise? 20   Total minutes per week of exercise 100  Patient Education   Previous Diabetes Education Yes (please comment)  4 years ago   Disease state  Definition of diabetes, type 1 and 2, and the diagnosis of diabetes   Nutrition management  Role of diet in the treatment of diabetes and the relationship between the three main macronutrients and blood glucose level;Food label reading, portion sizes and measuring food.   Physical activity and exercise  Role of exercise on diabetes management, blood pressure  control and cardiac health.   Medications Reviewed patients medication for diabetes, action, purpose, timing of dose and side effects.   Monitoring Purpose and frequency of SMBG.;Taught/discussed recording of test results and interpretation of SMBG.;Identified appropriate SMBG and/or A1C goals.   Chronic complications Relationship between chronic complications and blood glucose control   Psychosocial adjustment Role of stress on diabetes;Identified and addressed patients feelings and concerns about diabetes     Individualized Goals (developed by patient)   Reducing Risk Improve blood sugars Decrease medications Prevent diabetes complications Lose weight Lead a healthier lifestyle Become more fit     Outcomes   Expected Outcomes Demonstrated interest in learning. Expect positive outcomes      Individualized Plan for Diabetes Self-Management Training:   Learning Objective:  Patient will have a greater understanding of diabetes self-management. Patient education plan is to attend individual and/or group sessions per assessed needs and concerns.   Plan:   Patient Instructions  Check blood sugars 2 x day before breakfast and 2 hrs after supper every day Exercise:  Walk/run  for 20-30  minutes  5 days a week Eat 3 meals day,  2-3  snacks a day Space meals 4-6 hours apart Limit fried foods Make an eye doctor appointment Bring blood sugar records to the next class  Expected Outcomes:  Demonstrated interest in learning. Expect positive outcomes  Education material provided:  General Meal Planning Guidelines Simple Meal Plan  If problems or questions, patient to contact team via:  Sharion SettlerSheila Hadassah Rana, RN, CCM, CDE [phone]  Future DSME appointment:  March 02, 2016

## 2016-03-02 ENCOUNTER — Encounter: Payer: Self-pay | Admitting: Dietician

## 2016-03-02 ENCOUNTER — Encounter: Payer: BC Managed Care – PPO | Admitting: Dietician

## 2016-03-02 VITALS — Ht 63.0 in | Wt 189.1 lb

## 2016-03-02 DIAGNOSIS — E1165 Type 2 diabetes mellitus with hyperglycemia: Secondary | ICD-10-CM

## 2016-03-02 DIAGNOSIS — Z713 Dietary counseling and surveillance: Secondary | ICD-10-CM | POA: Diagnosis not present

## 2016-03-02 NOTE — Progress Notes (Signed)

## 2016-03-18 ENCOUNTER — Telehealth: Payer: Self-pay | Admitting: Dietician

## 2016-03-18 NOTE — Telephone Encounter (Signed)
Called patient to reschedule classes 2 and 3, which she missed on 03/09/16 and 03/16/16 due to illness. Offered next series, which will be 04/06/16 and 04/13/16, and requested a call back.

## 2016-04-21 ENCOUNTER — Encounter: Payer: Self-pay | Admitting: *Deleted

## 2017-02-10 ENCOUNTER — Other Ambulatory Visit: Payer: Self-pay | Admitting: Certified Nurse Midwife

## 2017-02-10 DIAGNOSIS — Z1231 Encounter for screening mammogram for malignant neoplasm of breast: Secondary | ICD-10-CM

## 2019-05-08 ENCOUNTER — Ambulatory Visit: Payer: BC Managed Care – PPO | Attending: Internal Medicine

## 2019-05-08 DIAGNOSIS — E222 Syndrome of inappropriate secretion of antidiuretic hormone: Secondary | ICD-10-CM | POA: Insufficient documentation

## 2019-05-08 DIAGNOSIS — Z20822 Contact with and (suspected) exposure to covid-19: Secondary | ICD-10-CM | POA: Insufficient documentation

## 2019-05-09 LAB — NOVEL CORONAVIRUS, NAA: SARS-CoV-2, NAA: NOT DETECTED

## 2019-05-29 ENCOUNTER — Other Ambulatory Visit: Payer: Self-pay

## 2019-05-29 ENCOUNTER — Ambulatory Visit: Payer: BC Managed Care – PPO | Attending: Internal Medicine

## 2019-05-29 DIAGNOSIS — Z23 Encounter for immunization: Secondary | ICD-10-CM

## 2019-05-29 NOTE — Progress Notes (Signed)
   Covid-19 Vaccination Clinic  Name:  JEANNIFER DRAKEFORD    MRN: [id number] DOB: May 26, 1965  05/29/2019  Ms. Krabbenhoft was observed post Covid-19 immunization for 15 minutes without incident. She was provided with Vaccine Information Sheet and instruction to access the V-Safe system.   Ms. Lasky was instructed to call 911 with any severe reactions post vaccine: Marland Kitchen Difficulty breathing  . Swelling of face and throat  . A fast heartbeat  . A bad rash all over body  . Dizziness and weakness   Immunizations Administered    Name Date Dose VIS Date Route   Pfizer COVID-19 Vaccine 05/29/2019  9:39 AM 0.3 mL 02/03/2019 Intramuscular   Manufacturer: [employer], [employer]   Lot: [id number]   NDC: [address]-0

## 2019-06-20 ENCOUNTER — Ambulatory Visit: Payer: BC Managed Care – PPO | Attending: Internal Medicine

## 2019-06-20 DIAGNOSIS — Z23 Encounter for immunization: Secondary | ICD-10-CM

## 2019-06-20 NOTE — Progress Notes (Signed)
   Covid-19 Vaccination Clinic  Name:  Erica Ayala    MRN: [id number] DOB: Apr 01, 1965  06/20/2019  [name] was observed post Covid-19 immunization for 15 minutes without incident. She was provided with Vaccine Information Sheet and instruction to access the V-Safe system.   [name] was instructed to call 911 with any severe reactions post vaccine: Marland Kitchen Difficulty breathing  . Swelling of face and throat  . A fast heartbeat  . A bad rash all over body  . Dizziness and weakness   Immunizations Administered    Name Date Dose VIS Date Route   Pfizer COVID-19 Vaccine 06/20/2019  3:40 PM 0.3 mL 04/19/2018 Intramuscular   Manufacturer: [employer], [employer]   Lot: [id number]   NDC: [address]-7

## 2019-08-17 ENCOUNTER — Other Ambulatory Visit: Payer: Self-pay | Admitting: Family Medicine

## 2019-08-17 DIAGNOSIS — Z1231 Encounter for screening mammogram for malignant neoplasm of breast: Secondary | ICD-10-CM

## 2019-08-22 ENCOUNTER — Ambulatory Visit
Admission: RE | Admit: 2019-08-22 | Discharge: 2019-08-22 | Disposition: A | Discharge status: Home / Self Care | Source: Ambulatory Visit | Attending: Family Medicine | Admitting: Family Medicine

## 2019-08-22 DIAGNOSIS — Z1231 Encounter for screening mammogram for malignant neoplasm of breast: Secondary | ICD-10-CM | POA: Insufficient documentation

## 2022-06-12 ENCOUNTER — Other Ambulatory Visit: Payer: Self-pay | Admitting: Family

## 2022-06-12 DIAGNOSIS — N926 Irregular menstruation, unspecified: Secondary | ICD-10-CM

## 2022-06-15 ENCOUNTER — Ambulatory Visit
Admission: RE | Admit: 2022-06-15 | Discharge: 2022-06-15 | Disposition: A | Discharge status: Home / Self Care | Source: Ambulatory Visit | Attending: Family | Admitting: Family

## 2022-06-15 DIAGNOSIS — N926 Irregular menstruation, unspecified: Secondary | ICD-10-CM | POA: Diagnosis present

## 2022-08-25 ENCOUNTER — Ambulatory Visit

## 2022-09-01 ENCOUNTER — Ambulatory Visit

## 2022-09-01 ENCOUNTER — Ambulatory Visit: Attending: Family | Admitting: Physical Therapy

## 2022-09-01 DIAGNOSIS — G8929 Other chronic pain: Secondary | ICD-10-CM | POA: Insufficient documentation

## 2022-09-01 DIAGNOSIS — M25611 Stiffness of right shoulder, not elsewhere classified: Secondary | ICD-10-CM | POA: Insufficient documentation

## 2022-09-01 DIAGNOSIS — M6281 Muscle weakness (generalized): Secondary | ICD-10-CM | POA: Insufficient documentation

## 2022-09-01 DIAGNOSIS — M25511 Pain in right shoulder: Secondary | ICD-10-CM | POA: Insufficient documentation

## 2022-09-03 ENCOUNTER — Ambulatory Visit: Admitting: Physical Therapy

## 2022-09-03 ENCOUNTER — Encounter

## 2022-09-08 ENCOUNTER — Encounter

## 2022-09-08 ENCOUNTER — Ambulatory Visit: Admitting: Physical Therapy

## 2022-09-08 ENCOUNTER — Encounter: Payer: Self-pay | Admitting: Physical Therapy

## 2022-09-08 DIAGNOSIS — M25511 Pain in right shoulder: Secondary | ICD-10-CM | POA: Diagnosis present

## 2022-09-08 DIAGNOSIS — M6281 Muscle weakness (generalized): Secondary | ICD-10-CM

## 2022-09-08 DIAGNOSIS — M25611 Stiffness of right shoulder, not elsewhere classified: Secondary | ICD-10-CM | POA: Diagnosis present

## 2022-09-08 DIAGNOSIS — G8929 Other chronic pain: Secondary | ICD-10-CM

## 2022-09-10 ENCOUNTER — Encounter

## 2022-09-10 ENCOUNTER — Ambulatory Visit: Admitting: Physical Therapy

## 2022-09-15 ENCOUNTER — Encounter

## 2022-09-15 ENCOUNTER — Ambulatory Visit: Admitting: Physical Therapy

## 2022-09-17 ENCOUNTER — Ambulatory Visit: Admitting: Physical Therapy

## 2022-09-17 ENCOUNTER — Encounter

## 2022-09-17 NOTE — Therapy (Signed)
OUTPATIENT PHYSICAL THERAPY SHOULDER EVALUATION   Patient Name: Erica Ayala MRN: [id number] DOB:05-Mar-1965, 57 y.o., female Today's Date: 09/09/2022  END OF SESSION:  PT End of Session - 09/17/22 0845     Visit Number 1    Number of Visits 12    Date for PT Re-Evaluation 10/20/22    PT Start Time 1637    PT Stop Time 1731    PT Time Calculation (min) 54 min             Past Medical History:  Diagnosis Date   Diabetes mellitus without complication (HCC)    Factor 5 Leiden mutation, heterozygous (HCC)    Hypertension    Past Surgical History:  Procedure Laterality Date   ABDOMINAL SURGERY     CARPAL TUNNEL RELEASE Right    CESAREAN SECTION     CHOLECYSTECTOMY     There are no problems to display for this patient.   PCP: Kandyce Rud, MD  REFERRING PROVIDER: Ronne Binning, NP  REFERRING DIAG: Right shoulder pain  THERAPY DIAG:  Chronic right shoulder pain  Shoulder joint stiffness, right  Muscle weakness (generalized)  Rationale for Evaluation and Treatment: Rehabilitation  ONSET DATE: 12/2021  SUBJECTIVE:                                                                                                                                                                                      SUBJECTIVE STATEMENT: Pt. Reports chronic R shoulder pain which started last year while putting a shelf into cupboard.  Pt. Reports hearing a "pop".  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  Pt. Has h/o R hand numbness/ neuropathy and CTR surgery in 2002.  Pt. Has h/o lumbar issues (bulging discs/lumbar stenosis) and goes to [facility] 1x/month for management.    Hand dominance: Right  PERTINENT HISTORY: See MD notes  PAIN:  Are you having pain? Yes: NPRS scale: 3/10 Pain location: R shoulder Pain description: sharp/ clicking Aggravating factors: overhead reaching/ OP Relieving factors: rest  PRECAUTIONS:  None  RED FLAGS: None   WEIGHT BEARING RESTRICTIONS: No  FALLS:  Has patient fallen in last 6 months? Yes. Number of falls 1 (fell on L elbow at work walking backwards)  LIVING ENVIRONMENT: Lives with: lives with their family Lives in: House/apartment Has following equipment at home: None  OCCUPATION: [employer]  PLOF: Independent  PATIENT GOALS:  Increase R shoulder ROM/ decrease pain  NEXT MD VISIT:  PRN  OBJECTIVE:   DIAGNOSTIC FINDINGS:  N/A  PATIENT SURVEYS:  FOTO initial 56/ goal 46  COGNITION: Overall  cognitive status: Within functional limits for tasks assessed     SENSATION: WFL  POSTURE: Slight forward head, rounded shoulder posture  UPPER EXTREMITY ROM:   Active ROM Right eval Left eval  Shoulder flexion 150 deg. (Clicking/ pain with OP) 162 deg.  Shoulder extension    Shoulder abduction 146 deg. 162 deg  Shoulder adduction    Shoulder internal rotation WNL WNL  Shoulder external rotation WNL (pain at end-range) WNL  Elbow flexion  WNL  Elbow extension  WNL  Wrist flexion    Wrist extension    Wrist ulnar deviation    Wrist radial deviation    Wrist pronation    Wrist supination    (Blank rows = not tested)  UPPER EXTREMITY MMT:  MMT Right eval Left eval  Shoulder flexion 4/5 4+/5  Shoulder extension    Shoulder abduction 4/5 4+/5  Shoulder adduction 4/5 4+/5  Shoulder internal rotation    Shoulder external rotation    Middle trapezius    Lower trapezius    Elbow flexion 4+ 4+  Elbow extension 4+ 4+  Wrist flexion    Wrist extension    Wrist ulnar deviation    Wrist radial deviation    Wrist pronation    Wrist supination    Grip strength (lbs) 52.4# 57.3#  (Blank rows = not tested)  SHOULDER SPECIAL TESTS: Impingement tests: Neer impingement test: positive  and Hawkins/Kennedy impingement test: positive  SLAP lesions: Biceps load test: negative Instability tests: Posterior drawer test:  negative Rotator cuff assessment: TBD  JOINT MOBILITY TESTING:  Good R shoulder capsular mobility as compared to L  PALPATION:  (+) R mid-deltoid tenderness. AC joint   TODAY'S TREATMENT:                                                                                                                                         DATE: 09/08/2022  EVALUATION/ SEE HEP (discussed importance of ROM in a pain tolerable range).    PATIENT EDUCATION: Education details: Access Code: [email or web address] Person educated: Patient Education method: Explanation, Demonstration, and Handouts Education comprehension: verbalized understanding and returned demonstration  HOME EXERCISE PROGRAM: Access Code: [email or web address] URL: [email or web address] Date: 09/09/2022 Prepared by: Dorene Grebe  Exercises - Standing Shoulder Extension with Dowel  - 1 x daily - 7 x weekly - 1 sets - 20 reps - Standing Shoulder Flexion ROM with Dowel  - 1 x daily - 7 x weekly - 1 sets - 20 reps - Standing Shoulder Abduction ROM with Dowel  - 1 x daily - 7 x weekly - 1 sets - 20 reps - Standing Bilateral Shoulder Internal Rotation AAROM with Dowel  - 1 x daily - 7 x weekly - 1 sets - 20 reps  ASSESSMENT:  CLINICAL IMPRESSION: Patient is a pleasant 57 y.o. female who was seen today for physical therapy evaluation and treatment for R shoulder  pain.  Pt. Presents with decrease R shoulder AROM as compared to L shoulder with overhead reaching.  Pt. Has marked muscle weakness in R shoulder shoulder.  Good cervical AROM (all planes).  Pt. Is currently not participating with exercise program.  Pt. Will benefit from skilled PT services to develop HEP to increase R shoulder ROM/ strength to improve pain-free mobility.    OBJECTIVE IMPAIRMENTS: decreased endurance, decreased mobility, decreased ROM, decreased strength, hypomobility, impaired flexibility, impaired UE functional use, improper body mechanics, postural dysfunction, and pain.    ACTIVITY LIMITATIONS: carrying, lifting, dressing, reach over head, and caring for others  PARTICIPATION LIMITATIONS: cleaning and occupation  PERSONAL FACTORS: Fitness and Past/current experiences are also affecting patient's functional outcome.   REHAB POTENTIAL: Good  CLINICAL DECISION MAKING: Stable/uncomplicated  EVALUATION COMPLEXITY: Low   GOALS: Goals reviewed with patient? Yes  SHORT TERM GOALS: Target date: 09/22/22  Pt. Independent with HEP to increase R shoulder flexion/ abduction to >160 deg. To improve overhead reaching. Baseline: Goal status: INITIAL  LONG TERM GOALS: Target date: 10/20/22  Pt. Will increase FOTO to 67 to improve pain-free mobility   Baseline: initial 56 Goal status: INITIAL  2.  Pt. Will increase R shoulder AROM (all planes) to WNL as compared to L shoulder to improve pain-free functional mobility.   Baseline:  See above Goal status: INITIAL  3.  Pt. Able to lift 10# box to eye level shelf with no c/o R shoulder pain.   Baseline:  Goal status: INITIAL  PLAN:  PT FREQUENCY: 1-2x/week  PT DURATION: 6 weeks  PLANNED INTERVENTIONS: Therapeutic exercises, Therapeutic activity, Neuromuscular re-education, Patient/Family education, Self Care, Joint mobilization, Dry Needling, Electrical stimulation, Cryotherapy, Moist heat, Taping, Manual therapy, and Re-evaluation  PLAN FOR NEXT SESSION: Reassess special test/ progress R sh. ROM and isometric ex.    Cammie Mcgee, PT, DPT # [phone] 09/17/2022, 8:49 AM

## 2022-09-22 ENCOUNTER — Encounter: Admitting: Physical Therapy

## 2022-09-22 ENCOUNTER — Encounter

## 2022-09-24 ENCOUNTER — Encounter

## 2022-09-24 ENCOUNTER — Ambulatory Visit: Attending: Family | Admitting: Physical Therapy

## 2022-09-24 ENCOUNTER — Encounter: Payer: Self-pay | Admitting: Physical Therapy

## 2022-09-24 DIAGNOSIS — G8929 Other chronic pain: Secondary | ICD-10-CM | POA: Diagnosis present

## 2022-09-24 DIAGNOSIS — M6281 Muscle weakness (generalized): Secondary | ICD-10-CM

## 2022-09-24 DIAGNOSIS — M25611 Stiffness of right shoulder, not elsewhere classified: Secondary | ICD-10-CM | POA: Diagnosis present

## 2022-09-24 DIAGNOSIS — M25511 Pain in right shoulder: Secondary | ICD-10-CM | POA: Insufficient documentation

## 2022-09-24 NOTE — Therapy (Signed)
OUTPATIENT PHYSICAL THERAPY SHOULDER TREATMENT   Patient Name: Erica Ayala MRN: [id number] DOB:Aug 29, 1965, 57 y.o., female Today's Date: 09/25/2022  END OF SESSION:  PT End of Session - 09/24/22 1646     Visit Number 2    Number of Visits 12    Date for PT Re-Evaluation 10/20/22    PT Start Time 1646    PT Stop Time 1730    PT Time Calculation (min) 44 min             Past Medical History:  Diagnosis Date   Diabetes mellitus without complication (HCC)    Factor 5 Leiden mutation, heterozygous (HCC)    Hypertension    Past Surgical History:  Procedure Laterality Date   ABDOMINAL SURGERY     CARPAL TUNNEL RELEASE Right    CESAREAN SECTION     CHOLECYSTECTOMY     There are no problems to display for this patient.   PCP: Kandyce Rud, MD  REFERRING PROVIDER: Ronne Binning, NP  REFERRING DIAG: Right shoulder pain  THERAPY DIAG:  Chronic right shoulder pain  Shoulder joint stiffness, right  Muscle weakness (generalized)  Rationale for Evaluation and Treatment: Rehabilitation  ONSET DATE: 12/2021  SUBJECTIVE:                                                                                                                                                                                      SUBJECTIVE STATEMENT: Pt. Reports chronic R shoulder pain which started last year while putting a shelf into cupboard.  Pt. Reports hearing a "pop".  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  Pt. Has h/o R hand numbness/ neuropathy and CTR surgery in 2002.  Pt. Has h/o lumbar issues (bulging discs/lumbar stenosis) and goes to [facility] 1x/month for management.    Hand dominance: Right  PERTINENT HISTORY: See MD notes  PAIN:  Are you having pain? Yes: NPRS scale: 3/10 Pain location: R shoulder Pain description: sharp/ clicking Aggravating factors: overhead reaching/ OP Relieving factors: rest  PRECAUTIONS:  None  RED FLAGS: None   WEIGHT BEARING RESTRICTIONS: No  FALLS:  Has patient fallen in last 6 months? Yes. Number of falls 1 (fell on L elbow at work walking backwards)  LIVING ENVIRONMENT: Lives with: lives with their family Lives in: House/apartment Has following equipment at home: None  OCCUPATION: [employer]  PLOF: Independent  PATIENT GOALS:  Increase R shoulder ROM/ decrease pain  NEXT MD VISIT:  PRN  OBJECTIVE:   DIAGNOSTIC FINDINGS:  N/A  PATIENT SURVEYS:  FOTO initial 56/ goal 8  COGNITION: Overall  cognitive status: Within functional limits for tasks assessed     SENSATION: WFL  POSTURE: Slight forward head, rounded shoulder posture  UPPER EXTREMITY ROM:   Active ROM Right eval Left eval  Shoulder flexion 150 deg. (Clicking/ pain with OP) 162 deg.  Shoulder extension    Shoulder abduction 146 deg. 162 deg  Shoulder adduction    Shoulder internal rotation WNL WNL  Shoulder external rotation WNL (pain at end-range) WNL  Elbow flexion  WNL  Elbow extension  WNL  Wrist flexion    Wrist extension    Wrist ulnar deviation    Wrist radial deviation    Wrist pronation    Wrist supination    (Blank rows = not tested)  UPPER EXTREMITY MMT:  MMT Right eval Left eval  Shoulder flexion 4/5 4+/5  Shoulder extension    Shoulder abduction 4/5 4+/5  Shoulder adduction 4/5 4+/5  Shoulder internal rotation    Shoulder external rotation    Middle trapezius    Lower trapezius    Elbow flexion 4+ 4+  Elbow extension 4+ 4+  Wrist flexion    Wrist extension    Wrist ulnar deviation    Wrist radial deviation    Wrist pronation    Wrist supination    Grip strength (lbs) 52.4# 57.3#  (Blank rows = not tested)  SHOULDER SPECIAL TESTS: Impingement tests: Neer impingement test: positive  and Hawkins/Kennedy impingement test: positive  SLAP lesions: Biceps load test: negative Instability tests: Posterior drawer test:  negative Rotator cuff assessment: TBD  JOINT MOBILITY TESTING:  Good R shoulder capsular mobility as compared to L  PALPATION:  (+) R mid-deltoid tenderness. AC joint   TODAY'S TREATMENT:                                                                                                                                         DATE: 09/25/2022  Subjective:  Pt. Reports no R shoulder pain prior to tx. Session.  Pt. States she had some R shoulder pain earlier in the week while just sitting down (no lifting or carrying).    There.ex.:  Discussed HEP.  Standing R shoulder isometrics at wall: flexion/ abduction/ IR/ ER/ extension 10x each with 5 sec. Holds.    See updated HEP  Manual tx.:  Supine R shoulder AAROM (all planes)- pain tolerable range.    Supine R shoulder AP/PA/inf. Grade II-III mobs. (2x20 sec. Each.  Supine R shoulder LAD (no pain).    Supine/seated R shoulder and UT STM    PATIENT EDUCATION: Education details: Access Code: [email or web address] Person educated: Patient Education method: Explanation, Demonstration, and Handouts Education comprehension: verbalized understanding and returned demonstration  HOME EXERCISE PROGRAM: Access Code: [email or web address] URL: [email or web address] Date: 09/09/2022 Prepared by: Dorene Grebe  Exercises - Standing Shoulder Extension with Dowel  - 1 x daily - 7 x weekly - 1 sets - 20  reps - Standing Shoulder Flexion ROM with Dowel  - 1 x daily - 7 x weekly - 1 sets - 20 reps - Standing Shoulder Abduction ROM with Dowel  - 1 x daily - 7 x weekly - 1 sets - 20 reps - Standing Bilateral Shoulder Internal Rotation AAROM with Dowel  - 1 x daily - 7 x weekly - 1 sets - 20 reps  Access Code: [email or web address] URL: [email or web address] Date: 09/24/2022 Prepared by: Dorene Grebe  Exercises - Standing Isometric Shoulder Flexion with Doorway - Arm Bent  - 1 x daily - 7 x weekly - 1 sets - 10 reps - 5 seconds hold - Standing Isometric  Shoulder Extension with Doorway - Arm Bent  - 1 x daily - 7 x weekly - 1 sets - 10 reps - 5 seconds hold - Standing Isometric Shoulder Abduction with Doorway - Arm Bent  - 1 x daily - 7 x weekly - 1 sets - 10 reps - 5 seconds hold - Standing Isometric Shoulder External Rotation with Doorway and Towel Roll  - 1 x daily - 7 x weekly - 1 sets - 10 reps - 5 seconds hold - Standing Isometric Shoulder Internal Rotation with Towel Roll at Doorway  - 1 x daily - 7 x weekly - 1 sets - 10 reps - 5 seconds hold - Prone Shoulder Horizontal Abduction  - 1 x daily - 7 x weekly - 1 sets - 10 reps - Prone Scapular Retraction Y  - 1 x daily - 7 x weekly - 1 sets - 10 reps  ASSESSMENT:  CLINICAL IMPRESSION: Pt. Presents with good R shoulder AROM today as compared to initial evalation.  Pt. Has marked muscle weakness in R shoulder shoulder and demonstrates good technique with isometrics.  Good cervical AROM (all planes).  See updated HEP.  Pt. Will benefit from skilled PT services to develop HEP to increase R shoulder ROM/ strength to improve pain-free mobility.    OBJECTIVE IMPAIRMENTS: decreased endurance, decreased mobility, decreased ROM, decreased strength, hypomobility, impaired flexibility, impaired UE functional use, improper body mechanics, postural dysfunction, and pain.   ACTIVITY LIMITATIONS: carrying, lifting, dressing, reach over head, and caring for others  PARTICIPATION LIMITATIONS: cleaning and occupation  PERSONAL FACTORS: Fitness and Past/current experiences are also affecting patient's functional outcome.   REHAB POTENTIAL: Good  CLINICAL DECISION MAKING: Stable/uncomplicated  EVALUATION COMPLEXITY: Low   GOALS: Goals reviewed with patient? Yes  SHORT TERM GOALS: Target date: 09/22/22  Pt. Independent with HEP to increase R shoulder flexion/ abduction to >160 deg. To improve overhead reaching. Baseline: Goal status: INITIAL  LONG TERM GOALS: Target date: 10/20/22  Pt. Will  increase FOTO to 67 to improve pain-free mobility   Baseline: initial 56 Goal status: INITIAL  2.  Pt. Will increase R shoulder AROM (all planes) to WNL as compared to L shoulder to improve pain-free functional mobility.   Baseline:  See above Goal status: INITIAL  3.  Pt. Able to lift 10# box to eye level shelf with no c/o R shoulder pain.   Baseline:  Goal status: INITIAL  PLAN:  PT FREQUENCY: 1-2x/week  PT DURATION: 6 weeks  PLANNED INTERVENTIONS: Therapeutic exercises, Therapeutic activity, Neuromuscular re-education, Patient/Family education, Self Care, Joint mobilization, Dry Needling, Electrical stimulation, Cryotherapy, Moist heat, Taping, Manual therapy, and Re-evaluation  PLAN FOR NEXT SESSION: Progress R shoulder stability.   Cammie Mcgee, PT, DPT # [phone] 09/25/2022, 7:30 PM

## 2022-09-29 ENCOUNTER — Encounter: Admitting: Physical Therapy

## 2022-10-01 ENCOUNTER — Ambulatory Visit: Admitting: Physical Therapy

## 2022-10-06 ENCOUNTER — Encounter: Admitting: Physical Therapy

## 2022-10-08 ENCOUNTER — Encounter: Admitting: Physical Therapy

## 2022-10-08 ENCOUNTER — Ambulatory Visit: Admitting: Physical Therapy

## 2022-10-13 ENCOUNTER — Encounter: Admitting: Physical Therapy

## 2022-10-15 ENCOUNTER — Encounter: Payer: Self-pay | Admitting: Physical Therapy

## 2022-10-15 ENCOUNTER — Ambulatory Visit: Admitting: Physical Therapy

## 2022-10-15 ENCOUNTER — Encounter: Admitting: Physical Therapy

## 2022-10-15 DIAGNOSIS — M6281 Muscle weakness (generalized): Secondary | ICD-10-CM

## 2022-10-15 DIAGNOSIS — M25611 Stiffness of right shoulder, not elsewhere classified: Secondary | ICD-10-CM

## 2022-10-15 DIAGNOSIS — G8929 Other chronic pain: Secondary | ICD-10-CM

## 2022-10-15 DIAGNOSIS — M25511 Pain in right shoulder: Secondary | ICD-10-CM | POA: Diagnosis not present

## 2022-10-15 NOTE — Therapy (Signed)
OUTPATIENT PHYSICAL THERAPY SHOULDER TREATMENT   Patient Name: Erica Ayala MRN: [id number] DOB:08/18/1965, 57 y.o., female Today's Date: 10/15/2022  END OF SESSION:  PT End of Session - 10/15/22 1801     Visit Number 3    Number of Visits 12    Date for PT Re-Evaluation 10/20/22    PT Start Time 1645    PT Stop Time 1715    PT Time Calculation (min) 30 min    Activity Tolerance Patient tolerated treatment well    Behavior During Therapy WFL for tasks assessed/performed              Past Medical History:  Diagnosis Date   Diabetes mellitus without complication (HCC)    Factor 5 Leiden mutation, heterozygous (HCC)    Hypertension    Past Surgical History:  Procedure Laterality Date   ABDOMINAL SURGERY     CARPAL TUNNEL RELEASE Right    CESAREAN SECTION     CHOLECYSTECTOMY     There are no problems to display for this patient.   PCP: Kandyce Rud, MD  REFERRING PROVIDER: Ronne Binning, NP  REFERRING DIAG: Right shoulder pain  THERAPY DIAG:  Chronic right shoulder pain  Shoulder joint stiffness, right  Muscle weakness (generalized)  Rationale for Evaluation and Treatment: Rehabilitation  ONSET DATE: 12/2021  SUBJECTIVE:                                                                                                                                                                                      SUBJECTIVE STATEMENT: Pt. Reports chronic R shoulder pain which started last year while putting a shelf into cupboard.  Pt. Reports hearing a "pop".  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  Pt. Has h/o R hand numbness/ neuropathy and CTR surgery in 2002.  Pt. Has h/o lumbar issues (bulging discs/lumbar stenosis) and goes to [facility] 1x/month for management.    Hand dominance: Right  PERTINENT HISTORY: See MD notes  PAIN:  Are you having pain? Yes: NPRS scale: 3/10 Pain location: R shoulder Pain  description: sharp/ clicking Aggravating factors: overhead reaching/ OP Relieving factors: rest  PRECAUTIONS: None  RED FLAGS: None   WEIGHT BEARING RESTRICTIONS: No  FALLS:  Has patient fallen in last 6 months? Yes. Number of falls 1 (fell on L elbow at work walking backwards)  LIVING ENVIRONMENT: Lives with: lives with their family Lives in: House/apartment Has following equipment at home: None  OCCUPATION: [employer]  PLOF: Independent  PATIENT GOALS:  Increase R shoulder ROM/ decrease pain  NEXT MD VISIT:  PRN  OBJECTIVE:   DIAGNOSTIC FINDINGS:  N/A  PATIENT SURVEYS:  FOTO initial 56/ goal 31  COGNITION: Overall cognitive status: Within functional limits for tasks assessed     SENSATION: WFL  POSTURE: Slight forward head, rounded shoulder posture  UPPER EXTREMITY ROM:   Active ROM Right eval Left eval  Shoulder flexion 150 deg. (Clicking/ pain with OP) 162 deg.  Shoulder extension    Shoulder abduction 146 deg. 162 deg  Shoulder adduction    Shoulder internal rotation WNL WNL  Shoulder external rotation WNL (pain at end-range) WNL  Elbow flexion  WNL  Elbow extension  WNL  Wrist flexion    Wrist extension    Wrist ulnar deviation    Wrist radial deviation    Wrist pronation    Wrist supination    (Blank rows = not tested)  UPPER EXTREMITY MMT:  MMT Right eval Left eval  Shoulder flexion 4/5 4+/5  Shoulder extension    Shoulder abduction 4/5 4+/5  Shoulder adduction 4/5 4+/5  Shoulder internal rotation    Shoulder external rotation    Middle trapezius    Lower trapezius    Elbow flexion 4+ 4+  Elbow extension 4+ 4+  Wrist flexion    Wrist extension    Wrist ulnar deviation    Wrist radial deviation    Wrist pronation    Wrist supination    Grip strength (lbs) 52.4# 57.3#  (Blank rows = not tested)  SHOULDER SPECIAL TESTS: Impingement tests: Neer impingement test: positive  and Hawkins/Kennedy  impingement test: positive  SLAP lesions: Biceps load test: negative Instability tests: Posterior drawer test: negative Rotator cuff assessment: TBD  JOINT MOBILITY TESTING:  Good R shoulder capsular mobility as compared to L  PALPATION:  (+) R mid-deltoid tenderness. AC joint   TODAY'S TREATMENT:                                                                                                                                         DATE: 10/15/2022  Subjective:  Pt reports to PT with no reports of R shoulder pain. Exercises have been going well over the last few weeks with no pain.  There.ex.:  Nautilus Exercises: Lat pulldowns (2x15, 40#),  Shoulder Rows (2x15, 40#), Shoulder Extensions (2x15, 30#)  Resisted Shoulder ER: red TB, 2x15  Banded Rows with red TB: 2x15  Resisted Shoulder Horizontal Abduction: 2x15, red TB  Discussed and modified HEP.   PATIENT EDUCATION: Education details: Access Code: [email or web address] Person educated: Patient Education method: Explanation, Demonstration, and Handouts Education comprehension: verbalized understanding and returned demonstration  HOME EXERCISE PROGRAM: Access Code: [email or web address] URL: [email or web address] Date: 09/09/2022 Prepared by: Dorene Grebe  Exercises - Standing Shoulder Extension with Dowel  - 1 x daily - 7 x weekly - 1 sets - 20 reps - Standing Shoulder Flexion ROM with Dowel  - 1 x daily - 7 x weekly -  1 sets - 20 reps - Standing Shoulder Abduction ROM with Dowel  - 1 x daily - 7 x weekly - 1 sets - 20 reps - Standing Bilateral Shoulder Internal Rotation AAROM with Dowel  - 1 x daily - 7 x weekly - 1 sets - 20 reps  Access Code: [email or web address] URL: [email or web address] Date: 09/24/2022 Prepared by: Dorene Grebe  Exercises - Standing Isometric Shoulder Flexion with Doorway - Arm Bent  - 1 x daily - 7 x weekly - 1 sets - 10 reps - 5 seconds hold - Standing Isometric Shoulder Extension with Doorway -  Arm Bent  - 1 x daily - 7 x weekly - 1 sets - 10 reps - 5 seconds hold - Standing Isometric Shoulder Abduction with Doorway - Arm Bent  - 1 x daily - 7 x weekly - 1 sets - 10 reps - 5 seconds hold - Standing Isometric Shoulder External Rotation with Doorway and Towel Roll  - 1 x daily - 7 x weekly - 1 sets - 10 reps - 5 seconds hold - Standing Isometric Shoulder Internal Rotation with Towel Roll at Doorway  - 1 x daily - 7 x weekly - 1 sets - 10 reps - 5 seconds hold - Prone Shoulder Horizontal Abduction  - 1 x daily - 7 x weekly - 1 sets - 10 reps - Prone Scapular Retraction Y  - 1 x daily - 7 x weekly - 1 sets - 10 reps  ASSESSMENT:  CLINICAL IMPRESSION: Pt presents to PT with reports of no R shoulder pain. Today's session consisted of reassessing R shoulder ROM and strength as well as introducing resisted strengthening exercises for her shoulders/back. Pt demonstrates full functional R shoulder AROM with no pain, as well as improved shoulder MMT strength. Pt tolerates all exercises well with no pain in the R shoulder. HEP updated to include banded resisted exercises. Pt. Will benefit from skilled PT services to develop HEP to increase R shoulder ROM/ strength to improve pain-free mobility.    OBJECTIVE IMPAIRMENTS: decreased endurance, decreased mobility, decreased ROM, decreased strength, hypomobility, impaired flexibility, impaired UE functional use, improper body mechanics, postural dysfunction, and pain.   ACTIVITY LIMITATIONS: carrying, lifting, dressing, reach over head, and caring for others  PARTICIPATION LIMITATIONS: cleaning and occupation  PERSONAL FACTORS: Fitness and Past/current experiences are also affecting patient's functional outcome.   REHAB POTENTIAL: Good  CLINICAL DECISION MAKING: Stable/uncomplicated  EVALUATION COMPLEXITY: Low   GOALS: Goals reviewed with patient? Yes  SHORT TERM GOALS: Target date: 09/22/22  Pt. Independent with HEP to increase R shoulder  flexion/ abduction to >160 deg. To improve overhead reaching. Baseline: Goal status: Goal met  LONG TERM GOALS: Target date: 10/20/22  Pt. Will increase FOTO to 67 to improve pain-free mobility   Baseline: initial 56 Goal status: INITIAL  2.  Pt. Will increase R shoulder AROM (all planes) to WNL as compared to L shoulder to improve pain-free functional mobility.   Baseline:  See above Goal status: INITIAL  3.  Pt. Able to lift 10# box to eye level shelf with no c/o R shoulder pain.   Baseline:  Goal status: INITIAL  PLAN:  PT FREQUENCY: 1-2x/week  PT DURATION: 6 weeks  PLANNED INTERVENTIONS: Therapeutic exercises, Therapeutic activity, Neuromuscular re-education, Patient/Family education, Self Care, Joint mobilization, Dry Needling, Electrical stimulation, Cryotherapy, Moist heat, Taping, Manual therapy, and Re-evaluation  PLAN FOR NEXT SESSION: Progress shoulder strengthening/stabilization exercises as tolerated, review new HEP.  1 more tx. Session/ Check  LTGs  Cammie Mcgee, PT, DPT # [phone] Cena Benton, SPT 10/15/22, 6:02 PM

## 2022-10-22 ENCOUNTER — Ambulatory Visit: Admitting: Physical Therapy

## 2022-12-07 ENCOUNTER — Emergency Department
Admission: EM | Admit: 2022-12-07 | Discharge: 2022-12-07 | Disposition: A | Discharge status: Home / Self Care | Attending: Emergency Medicine | Admitting: Emergency Medicine

## 2022-12-07 ENCOUNTER — Emergency Department

## 2022-12-07 ENCOUNTER — Other Ambulatory Visit: Payer: Self-pay

## 2022-12-07 DIAGNOSIS — E119 Type 2 diabetes mellitus without complications: Secondary | ICD-10-CM | POA: Diagnosis not present

## 2022-12-07 DIAGNOSIS — M542 Cervicalgia: Secondary | ICD-10-CM | POA: Diagnosis present

## 2022-12-07 DIAGNOSIS — Y92410 Unspecified street and highway as the place of occurrence of the external cause: Secondary | ICD-10-CM | POA: Insufficient documentation

## 2022-12-07 DIAGNOSIS — I10 Essential (primary) hypertension: Secondary | ICD-10-CM | POA: Diagnosis not present
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
## 2022-12-07 NOTE — Discharge Instructions (Addendum)
You can take 650 mg of Tylenol and 600 mg of ibuprofen every 6 hours as needed for pain.  Return to the ED if you have development of chest pain, shortness of breath, abdominal pain or abdominal bruising.
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
## 2023-02-24 DIAGNOSIS — I219 Acute myocardial infarction, unspecified: Secondary | ICD-10-CM

## 2023-02-24 HISTORY — DX: Acute myocardial infarction, unspecified: I21.9

## 2023-04-02 DIAGNOSIS — I213 ST elevation (STEMI) myocardial infarction of unspecified site: Secondary | ICD-10-CM | POA: Insufficient documentation

## 2023-04-02 DIAGNOSIS — I2119 ST elevation (STEMI) myocardial infarction involving other coronary artery of inferior wall: Secondary | ICD-10-CM

## 2023-04-02 HISTORY — DX: ST elevation (STEMI) myocardial infarction involving other coronary artery of inferior wall: I21.19

## 2023-04-02 HISTORY — PX: CORONARY ANGIOPLASTY WITH STENT PLACEMENT: SHX49

## 2023-04-20 ENCOUNTER — Encounter: Attending: Internal Medicine | Admitting: *Deleted

## 2023-04-20 DIAGNOSIS — I213 ST elevation (STEMI) myocardial infarction of unspecified site: Secondary | ICD-10-CM | POA: Insufficient documentation

## 2023-04-20 DIAGNOSIS — K219 Gastro-esophageal reflux disease without esophagitis: Secondary | ICD-10-CM | POA: Insufficient documentation

## 2023-04-20 DIAGNOSIS — Z955 Presence of coronary angioplasty implant and graft: Secondary | ICD-10-CM | POA: Insufficient documentation

## 2023-04-20 DIAGNOSIS — D6859 Other primary thrombophilia: Secondary | ICD-10-CM | POA: Insufficient documentation

## 2023-04-20 NOTE — Progress Notes (Signed)
 Initial phone call completed. Diagnosis can be found in [facility] 2/21. EP Orientation scheduled for Wednesday 2/26 at 8am.

## 2023-04-21 ENCOUNTER — Encounter

## 2023-04-21 VITALS — Ht 64.75 in | Wt 169.5 lb

## 2023-04-21 DIAGNOSIS — Z955 Presence of coronary angioplasty implant and graft: Secondary | ICD-10-CM

## 2023-04-21 DIAGNOSIS — I213 ST elevation (STEMI) myocardial infarction of unspecified site: Secondary | ICD-10-CM | POA: Diagnosis present

## 2023-04-21 NOTE — Progress Notes (Signed)
 Assessment start time: 9:14 AM  Digestive issues/concerns: no known food allergies   24-hours Recall: B: coffee, sugar free creamer, cheerios, protein drinks,  Snack: atkin bars L: salad with grilled chicken D: grilled chicken, veggies,   Beverages water, zero soda, coffee,  Alcohol none Caffeine coffee  Education r/t nutrition plan Patient is not drinking sugary beverages. She and her mother have DM, so she is carb conscious. Tries to pick complex carbs and keep them controlled. She was not aware of how many carbs to eat at each meal. Educated on carb goal of 30-60g per meal, with snacks roughly half as much. Her A1C was 11% 2 weeks ago, which was alarming. Spoke with her, she reports her life became stressful with several family members in and out of the hospital. She also reports her Dr placed her on a different medication for her DM. Encouraged her to communicate with Dr about her blood sugars, and to continue to make good carb choices. Educated on [employer], types of fats, sources, and how to read labels. Went over sodium and its effects on blood pressure and heart health. Build out several small meals and snacks that are easier to make for when life gets suddenly busy.     Goal 1: Eat 15-30gProtein and 30-60gCarbs at each meal. Goal 2: Read labels and reduce sodium intake to below 2300mg . Ideally 1500mg  per day.  Goal 3: Reduce saturated fat, less than 12g per day. Replace bad fats for more heart healthy fats.   End time 9:51 AM

## 2023-04-21 NOTE — Progress Notes (Signed)
 Cardiac Individual Treatment Plan  Patient Details  Name: Erica Ayala MRN: [id number] Date of Birth: 1965-09-24 Referring Provider:   Flowsheet Row Cardiac Rehab from 04/21/2023 in [facility] Cardiac and Pulmonary Rehab  Referring Provider Clotilde Dieter, DO       Initial Encounter Date:  Flowsheet Row Cardiac Rehab from 04/21/2023 in [facility] Cardiac and Pulmonary Rehab  Date 04/21/23       Visit Diagnosis: ST elevation myocardial infarction (STEMI), unspecified artery (HCC)  Status post coronary artery stent placement  Patient's Home Medications on Admission:  Current Outpatient Medications:    albuterol (PROVENTIL HFA;VENTOLIN HFA) 108 (90 Base) MCG/ACT inhaler, Inhale 2 puffs into the lungs every 4 (four) hours as needed., Disp: , Rfl:    aspirin 81 MG chewable tablet, Chew 81 mg by mouth daily., Disp: , Rfl:    atorvastatin (LIPITOR) 80 MG tablet, Take 80 mg by mouth daily., Disp: , Rfl:    canagliflozin (INVOKANA) 100 MG TABS tablet, Take 100 mg by mouth daily before breakfast., Disp: , Rfl:    cetirizine (ZYRTEC) 10 MG tablet, Take 10 mg by mouth daily., Disp: , Rfl:    Cholecalciferol (D 1000) 25 MCG (1000 UT) capsule, Take 1,000 Units by mouth daily., Disp: , Rfl:    Cyanocobalamin (VITAMIN B-12 PO), Take 1 tablet by mouth daily., Disp: , Rfl:    DULoxetine (CYMBALTA) 60 MG capsule, Take 20 mg by mouth daily. Patient taking 20 mg once daily, Disp: , Rfl:    gabapentin (NEURONTIN) 300 MG capsule, Take 300 mg by mouth at bedtime. (Patient not taking: Reported on 04/20/2023), Disp: , Rfl:    glipiZIDE (GLUCOTROL XL) 5 MG 24 hr tablet, Take 10 mg by mouth daily., Disp: , Rfl:    Insulin Glargine Solostar (LANTUS) 100 UNIT/ML Solostar Pen, Inject 15 Units into the skin at bedtime., Disp: , Rfl:    lisinopril-hydrochlorothiazide (PRINZIDE,ZESTORETIC) 10-12.5 MG tablet, Take 1 tablet by mouth daily. (Patient not taking: Reported on 04/20/2023), Disp: , Rfl:    metFORMIN (GLUCOPHAGE-XR)  500 MG 24 hr tablet, Take 1,000 mg by mouth daily with supper. (Patient not taking: Reported on 04/20/2023), Disp: , Rfl:    metoprolol succinate (TOPROL-XL) 25 MG 24 hr tablet, Take 1 tablet by mouth daily., Disp: , Rfl:    Multiple Vitamin (MULTIVITAMIN) tablet, Take 1 tablet by mouth daily., Disp: , Rfl:    nitroGLYCERIN (NITRODUR - DOSED IN MG/24 HR) 0.4 mg/hr patch, Place 0.4 mg onto the skin daily., Disp: , Rfl:    pantoprazole (PROTONIX) 20 MG tablet, Take 20 mg by mouth daily. (Patient not taking: Reported on 04/20/2023), Disp: , Rfl:    progesterone (PROMETRIUM) 200 MG capsule, Take 200 mg by mouth daily., Disp: , Rfl:    ticagrelor (BRILINTA) 90 MG TABS tablet, Take 90 mg by mouth 2 (two) times daily., Disp: , Rfl:   Past Medical History: Past Medical History:  Diagnosis Date   Diabetes mellitus without complication (HCC)    Factor 5 Leiden mutation, heterozygous (HCC)    Hypertension     Tobacco Use: Social History   Tobacco Use  Smoking Status Never  Smokeless Tobacco Never    Labs: Review Flowsheet        No data to display           Exercise Target Goals: Exercise Program Goal: Individual exercise prescription set using results from initial 6 min walk test and THRR while considering  patient's activity barriers and safety.  Exercise Prescription Goal: Initial exercise prescription builds to 30-45 minutes a day of aerobic activity, 2-3 days per week.  Home exercise guidelines will be given to patient during program as part of exercise prescription that the participant will acknowledge.   Education: Aerobic Exercise: - Group verbal and visual presentation on the components of exercise prescription. Introduces F.I.T.T principle from ACSM for exercise prescriptions.  Reviews F.I.T.T. principles of aerobic exercise including progression. Written material given at graduation.   Education: Resistance Exercise: - Group verbal and visual presentation on the  components of exercise prescription. Introduces F.I.T.T principle from ACSM for exercise prescriptions  Reviews F.I.T.T. principles of resistance exercise including progression. Written material given at graduation.    Education: Exercise & Equipment Safety: - Individual verbal instruction and demonstration of equipment use and safety with use of the equipment. Flowsheet Row Cardiac Rehab from 04/21/2023 in [facility] Cardiac and Pulmonary Rehab  Date 04/21/23  Educator NT  Instruction Review Code 1- Verbalizes Understanding       Education: Exercise Physiology & General Exercise Guidelines: - Group verbal and written instruction with models to review the exercise physiology of the cardiovascular system and associated critical values. Provides general exercise guidelines with specific guidelines to those with heart or lung disease.    Education: Flexibility, Balance, Mind/Body Relaxation: - Group verbal and visual presentation with interactive activity on the components of exercise prescription. Introduces F.I.T.T principle from ACSM for exercise prescriptions. Reviews F.I.T.T. principles of flexibility and balance exercise training including progression. Also discusses the mind body connection.  Reviews various relaxation techniques to help reduce and manage stress (i.e. Deep breathing, progressive muscle relaxation, and visualization). Balance handout provided to take home. Written material given at graduation.   Activity Barriers & Risk Stratification:  Activity Barriers & Cardiac Risk Stratification - 04/20/23 1503       Activity Barriers & Cardiac Risk Stratification   Activity Barriers Back Problems;Other (comment)    Comments neuropathy    Cardiac Risk Stratification Moderate             6 Minute Walk:  6 Minute Walk     Row Name 04/21/23 1001         6 Minute Walk   Phase Initial     Distance 1240 feet     Walk Time 6 minutes     # of Rest Breaks 0     MPH 2.35      METS 3.43     RPE 9     Perceived Dyspnea  0     VO2 Peak 12.01     Symptoms Yes (comment)     Comments Feet tingling     Resting HR 71 bpm     Resting BP 112/64     Resting Oxygen Saturation  97 %     Exercise Oxygen Saturation  during 6 min walk 99 %     Max Ex. HR 93 bpm     Max Ex. BP 128/66     2 Minute Post BP 116/64              Oxygen Initial Assessment:   Oxygen Re-Evaluation:   Oxygen Discharge (Final Oxygen Re-Evaluation):   Initial Exercise Prescription:  Initial Exercise Prescription - 04/21/23 1000       Date of Initial Exercise RX and Referring Provider   Date 04/21/23    Referring Provider Rozell Searing Custovic, DO      Oxygen   Maintain Oxygen Saturation 88% or higher  Treadmill   MPH 2.3    Grade 1    Minutes 15    METs 3.08      NuStep   Level 3    SPM 80    Minutes 15    METs 3.43      REL-XR   Level 3    Speed 50    Minutes 15    METs 3.43      Prescription Details   Frequency (times per week) 3    Duration Progress to 30 minutes of continuous aerobic without signs/symptoms of physical distress      Intensity   THRR 40-80% of Max Heartrate 108-146    Ratings of Perceived Exertion 11-13    Perceived Dyspnea 0-4      Progression   Progression Continue to progress workloads to maintain intensity without signs/symptoms of physical distress.      Resistance Training   Training Prescription Yes    Weight 4 lb    Reps 10-15             Perform Capillary Blood Glucose checks as needed.  Exercise Prescription Changes:   Exercise Prescription Changes     Row Name 04/21/23 1000             Response to Exercise   Blood Pressure (Admit) 112/64       Blood Pressure (Exercise) 128/66       Blood Pressure (Exit) 116/64       Heart Rate (Admit) 71 bpm       Heart Rate (Exercise) 93 bpm       Heart Rate (Exit) 76 bpm       Oxygen Saturation (Admit) 97 %       Oxygen Saturation (Exercise) 99 %       Rating of  Perceived Exertion (Exercise) 9       Perceived Dyspnea (Exercise) 0       Symptoms feet tingling       Comments Results                Exercise Comments:   Exercise Goals and Review:   Exercise Goals     Row Name 04/21/23 1000             Exercise Goals   Increase Physical Activity Yes       Intervention Provide advice, education, support and counseling about physical activity/exercise needs.;Develop an individualized exercise prescription for aerobic and resistive training based on initial evaluation findings, risk stratification, comorbidities and participant's personal goals.       Expected Outcomes Short Term: Attend rehab on a regular basis to increase amount of physical activity.;Long Term: Add in home exercise to make exercise part of routine and to increase amount of physical activity.;Long Term: Exercising regularly at least 3-5 days a week.       Increase Strength and Stamina Yes       Intervention Provide advice, education, support and counseling about physical activity/exercise needs.;Develop an individualized exercise prescription for aerobic and resistive training based on initial evaluation findings, risk stratification, comorbidities and participant's personal goals.       Expected Outcomes Short Term: Increase workloads from initial exercise prescription for resistance, speed, and METs.;Long Term: Improve cardiorespiratory fitness, muscular endurance and strength as measured by increased METs and functional capacity ( );Short Term: Perform resistance training exercises routinely during rehab and add in resistance training at home       Able to understand and use  rate of perceived exertion (RPE) scale Yes       Intervention Provide education and explanation on how to use RPE scale       Expected Outcomes Short Term: Able to use RPE daily in rehab to express subjective intensity level;Long Term:  Able to use RPE to guide intensity level when exercising  independently       Able to understand and use Dyspnea scale Yes       Intervention Provide education and explanation on how to use Dyspnea scale       Expected Outcomes Short Term: Able to use Dyspnea scale daily in rehab to express subjective sense of shortness of breath during exertion;Long Term: Able to use Dyspnea scale to guide intensity level when exercising independently       Knowledge and understanding of Target Heart Rate Range (THRR) Yes       Intervention Provide education and explanation of THRR including how the numbers were predicted and where they are located for reference       Expected Outcomes Short Term: Able to state/look up THRR;Long Term: Able to use THRR to govern intensity when exercising independently;Short Term: Able to use daily as guideline for intensity in rehab       Able to check pulse independently Yes       Intervention Review the importance of being able to check your own pulse for safety during independent exercise;Provide education and demonstration on how to check pulse in carotid and radial arteries.       Expected Outcomes Short Term: Able to explain why pulse checking is important during independent exercise;Long Term: Able to check pulse independently and accurately       Understanding of Exercise Prescription Yes       Intervention Provide education, explanation, and written materials on patient's individual exercise prescription       Expected Outcomes Long Term: Able to explain home exercise prescription to exercise independently;Short Term: Able to explain program exercise prescription                Exercise Goals Re-Evaluation :   Discharge Exercise Prescription (Final Exercise Prescription Changes):  Exercise Prescription Changes - 04/21/23 1000       Response to Exercise   Blood Pressure (Admit) 112/64    Blood Pressure (Exercise) 128/66    Blood Pressure (Exit) 116/64    Heart Rate (Admit) 71 bpm    Heart Rate (Exercise) 93 bpm     Heart Rate (Exit) 76 bpm    Oxygen Saturation (Admit) 97 %    Oxygen Saturation (Exercise) 99 %    Rating of Perceived Exertion (Exercise) 9    Perceived Dyspnea (Exercise) 0    Symptoms feet tingling    Comments Results             Nutrition:  Target Goals: Understanding of nutrition guidelines, daily intake of sodium 1500mg , cholesterol 200mg , calories 30% from fat and 7% or less from saturated fats, daily to have 5 or more servings of fruits and vegetables.  Education: All About Nutrition: -Group instruction provided by verbal, written material, interactive activities, discussions, models, and posters to present general guidelines for heart healthy nutrition including fat, fiber, MyPlate, the role of sodium in heart healthy nutrition, utilization of the nutrition label, and utilization of this knowledge for meal planning. Follow up email sent as well. Written material given at graduation. Flowsheet Row Cardiac Rehab from 04/21/2023 in [facility] Cardiac and Pulmonary Rehab  Education need identified 04/21/23       Biometrics:  Pre Biometrics - 04/21/23 1001       Pre Biometrics   Height 5' 4.75" (1.645 m)    Weight 169 lb 8 oz (76.9 kg)    Waist Circumference 37 inches    Hip Circumference 39 inches    Waist to Hip Ratio 0.95 %    BMI (Calculated) 28.41    Single Leg Stand 5.2 seconds              Nutrition Therapy Plan and Nutrition Goals:   Nutrition Assessments:  MEDIFICTS Score Key: >=70 Need to make dietary changes  40-70 Heart Healthy Diet <= 40 Therapeutic Level Cholesterol Diet  Flowsheet Row Cardiac Rehab from 04/21/2023 in [facility] Cardiac and Pulmonary Rehab  Picture Your Plate Total Score on Admission 68      Picture Your Plate Scores: <16 Unhealthy dietary pattern with much room for improvement. 41-50 Dietary pattern unlikely to meet recommendations for good health and room for improvement. 51-60 More healthful dietary pattern, with some room  for improvement.  >60 Healthy dietary pattern, although there may be some specific behaviors that could be improved.    Nutrition Goals Re-Evaluation:   Nutrition Goals Discharge (Final Nutrition Goals Re-Evaluation):   Psychosocial: Target Goals: Acknowledge presence or absence of significant depression and/or stress, maximize coping skills, provide positive support system. Participant is able to verbalize types and ability to use techniques and skills needed for reducing stress and depression.   Education: Stress, Anxiety, and Depression - Group verbal and visual presentation to define topics covered.  Reviews how body is impacted by stress, anxiety, and depression.  Also discusses healthy ways to reduce stress and to treat/manage anxiety and depression.  Written material given at graduation.   Education: Sleep Hygiene -Provides group verbal and written instruction about how sleep can affect your health.  Define sleep hygiene, discuss sleep cycles and impact of sleep habits. Review good sleep hygiene tips.    Initial Review & Psychosocial Screening:  Initial Psych Review & Screening - 04/20/23 1511       Initial Review   Current issues with Current Sleep Concerns;Current Stress Concerns    Source of Stress Concerns Family      Family Dynamics   Good Support System? Yes   husband, family     Barriers   Psychosocial barriers to participate in program There are no identifiable barriers or psychosocial needs.;The patient should benefit from training in stress management and relaxation.      Screening Interventions   Interventions Encouraged to exercise;Provide feedback about the scores to participant;To provide support and resources with identified psychosocial needs    Expected Outcomes Short Term goal: Utilizing psychosocial counselor, staff and physician to assist with identification of specific Stressors or current issues interfering with healing process. Setting desired goal  for each stressor or current issue identified.;Long Term Goal: Stressors or current issues are controlled or eliminated.;Short Term goal: Identification and review with participant of any Quality of Life or Depression concerns found by scoring the questionnaire.;Long Term goal: The participant improves quality of Life and PHQ9 Scores as seen by post scores and/or verbalization of changes             Quality of Life Scores:   Quality of Life - 04/21/23 0958       Quality of Life   Select Quality of Life      Quality of Life Scores   Health/Function  Pre 16.4 %    Socioeconomic Pre 23.63 %    Psych/Spiritual Pre 28.29 %    Family Pre 25.2 %    GLOBAL Pre 21.69 %            Scores of 19 and below usually indicate a poorer quality of life in these areas.  A difference of  2-3 points is a clinically meaningful difference.  A difference of 2-3 points in the total score of the Quality of Life Index has been associated with significant improvement in overall quality of life, self-image, physical symptoms, and general health in studies assessing change in quality of life.  PHQ-9: Review Flowsheet       04/21/2023 02/27/2016  Depression screen PHQ 2/9  Decreased Interest 1 0  Down, Depressed, Hopeless 1 1  PHQ - 2 Score 2 1  Altered sleeping 2 -  Tired, decreased energy 3 -  Change in appetite 0 -  Feeling bad or failure about yourself  0 -  Trouble concentrating 1 -  Moving slowly or fidgety/restless 0 -  Suicidal thoughts 0 -  PHQ-9 Score 8 -  Difficult doing work/chores Somewhat difficult -   Interpretation of Total Score  Total Score Depression Severity:  1-4 = Minimal depression, 5-9 = Mild depression, 10-14 = Moderate depression, 15-19 = Moderately severe depression, 20-27 = Severe depression   Psychosocial Evaluation and Intervention:  Psychosocial Evaluation - 04/20/23 1521       Psychosocial Evaluation & Interventions   Interventions Encouraged to exercise with the  program and follow exercise prescription;Stress management education;Relaxation education    Comments Shatoria is 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 She had her MI while visiting her hospitalized father. She is helping care for her dad and her mother. Her husband and her also help care for her mother in law who had a stroke soon after Leita's father-in-law passed away in November 30, 2023. In addition to her parents and in law stress, she also mentions she helps with her husband who is a 100% disabled veteran who struggles with PTSD. 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 Today they also had to put down their family dog. She admits that this all has been a tremendous amount of stress for her and she knows she has to take care of herself. Her duloxetine is helping and she is trying lifestyle changes as well.  She is looking forward to the classes and developing a consistent exercise routine. She is very thankful for her support system and ability to recover.    Expected Outcomes Short: attend cardiac rehab for education and exercise. Long: develop and maintain positive self care habits    Continue Psychosocial Services  Follow up required by staff             Psychosocial Re-Evaluation:   Psychosocial Discharge (Final Psychosocial Re-Evaluation):   Vocational Rehabilitation: Provide vocational rehab assistance to qualifying candidates.   Vocational Rehab Evaluation & Intervention:  Vocational Rehab - 04/20/23 1510       Initial Vocational Rehab Evaluation & Intervention   Assessment shows need for Vocational Rehabilitation No             Education: Education Goals: Education classes will be provided on a variety of topics geared toward better understanding of heart health and risk factor modification. Participant will state understanding/return demonstration of topics presented as noted by education test  scores.  Learning Barriers/Preferences:  Learning Barriers/Preferences - 04/20/23 1510       Learning Barriers/Preferences   Learning Barriers None    Learning Preferences None             General Cardiac Education Topics:  AED/CPR: - Group verbal and written instruction with the use of models to demonstrate the basic use of the AED with the basic ABC's of resuscitation.   Anatomy and Cardiac Procedures: - Group verbal and visual presentation and models provide information about basic cardiac anatomy and function. Reviews the testing methods done to diagnose heart disease and the outcomes of the test results. Describes the treatment choices: Medical Management, Angioplasty, or Coronary Bypass Surgery for treating various heart conditions including Myocardial Infarction, Angina, Valve Disease, and Cardiac Arrhythmias.  Written material given at graduation. Flowsheet Row Cardiac Rehab from 04/21/2023 in [facility] Cardiac and Pulmonary Rehab  Education need identified 04/21/23       Medication Safety: - Group verbal and visual instruction to review commonly prescribed medications for heart and lung disease. Reviews the medication, class of the drug, and side effects. Includes the steps to properly store meds and maintain the prescription regimen.  Written material given at graduation.   Intimacy: - Group verbal instruction through game format to discuss how heart and lung disease can affect sexual intimacy. Written material given at graduation..   Know Your Numbers and Heart Failure: - Group verbal and visual instruction to discuss disease risk factors for cardiac and pulmonary disease and treatment options.  Reviews associated critical values for Overweight/Obesity, Hypertension, Cholesterol, and Diabetes.  Discusses basics of heart failure: signs/symptoms and treatments.  Introduces Heart Failure Zone chart for action plan for heart failure.  Written material given at  graduation.   Infection Prevention: - Provides verbal and written material to individual with discussion of infection control including proper hand washing and proper equipment cleaning during exercise session. Flowsheet Row Cardiac Rehab from 04/21/2023 in [facility] Cardiac and Pulmonary Rehab  Date 04/21/23  Educator NT  Instruction Review Code 1- Verbalizes Understanding       Falls Prevention: - Provides verbal and written material to individual with discussion of falls prevention and safety. Flowsheet Row Cardiac Rehab from 04/21/2023 in [facility] Cardiac and Pulmonary Rehab  Date 04/21/23  Educator NT  Instruction Review Code 1- Verbalizes Understanding       Other: -Provides group and verbal instruction on various topics (see comments)   Knowledge Questionnaire Score:  Knowledge Questionnaire Score - 04/21/23 4782       Knowledge Questionnaire Score   Pre Score 24/26             Core Components/Risk Factors/Patient Goals at Admission:  Personal Goals and Risk Factors at Admission - 04/20/23 1504       Core Components/Risk Factors/Patient Goals on Admission    Weight Management Yes;Weight Loss    Intervention Weight Management: Develop a combined nutrition and exercise program designed to reach desired caloric intake, while maintaining appropriate intake of nutrient and fiber, sodium and fats, and appropriate energy expenditure required for the weight goal.;Weight Management: Provide education and appropriate resources to help participant work on and attain dietary goals.;Weight Management/Obesity: Establish reasonable short term and long term weight goals.    Admit Weight 163 lb (73.9 kg)    Expected Outcomes Long Term: Adherence to nutrition and physical activity/exercise program aimed toward attainment of established weight goal;Short Term: Continue to assess and modify interventions until short term weight is  achieved;Weight Loss: Understanding of general recommendations  for a balanced deficit meal plan, which promotes 1-2 lb weight loss per week and includes a negative energy balance of [phone] kcal/d;Understanding recommendations for meals to include 15-35% energy as protein, 25-35% energy from fat, 35-60% energy from carbohydrates, less than 200mg  of dietary cholesterol, 20-35 gm of total fiber daily;Understanding of distribution of calorie intake throughout the day with the consumption of 4-5 meals/snacks    Diabetes Yes    Intervention Provide education about signs/symptoms and action to take for hypo/hyperglycemia.;Provide education about proper nutrition, including hydration, and aerobic/resistive exercise prescription along with prescribed medications to achieve blood glucose in normal ranges: Fasting glucose 65-99 mg/dL    Expected Outcomes Short Term: Participant verbalizes understanding of the signs/symptoms and immediate care of hyper/hypoglycemia, proper foot care and importance of medication, aerobic/resistive exercise and nutrition plan for blood glucose control.;Long Term: Attainment of HbA1C < 7%.    Hypertension Yes    Intervention Provide education on lifestyle modifcations including regular physical activity/exercise, weight management, moderate sodium restriction and increased consumption of fresh fruit, vegetables, and low fat dairy, alcohol moderation, and smoking cessation.;Monitor prescription use compliance.    Expected Outcomes Short Term: Continued assessment and intervention until BP is < 140/76mm HG in hypertensive participants. < 130/73mm HG in hypertensive participants with diabetes, heart failure or chronic kidney disease.;Long Term: Maintenance of blood pressure at goal levels.             Education:Diabetes - Individual verbal and written instruction to review signs/symptoms of diabetes, desired ranges of glucose level fasting, after meals and with exercise. Acknowledge that pre and post exercise glucose checks will be done for 3  sessions at entry of program. Flowsheet Row Cardiac Rehab from 04/21/2023 in [facility] Cardiac and Pulmonary Rehab  Date 04/20/23  Educator [facility]  Instruction Review Code 1- Verbalizes Understanding       Core Components/Risk Factors/Patient Goals Review:    Core Components/Risk Factors/Patient Goals at Discharge (Final Review):    ITP Comments:  ITP Comments     Row Name 04/20/23 1529 04/21/23 0956         ITP Comments Initial phone call completed. Diagnosis can be found in [facility] 2/21. EP Orientation scheduled for Wednesday 2/26 at 8am. Completed and gym orientation. Initial ITP created and sent for review to Dr. Bethann Punches, Medical Director.               Comments: Initial ITP

## 2023-04-21 NOTE — Patient Instructions (Addendum)
 Patient Instructions  Patient Details  Name: Erica Ayala MRN: [id number] Date of Birth: 10-02-1965 Referring Provider:  Clotilde Dieter, DO  Below are your personal goals for exercise, nutrition, and risk factors. Our goal is to help you stay on track towards obtaining and maintaining these goals. We will be discussing your progress on these goals with you throughout the program.  Initial Exercise Prescription:  Initial Exercise Prescription - 04/21/23 1000       Date of Initial Exercise RX and Referring Provider   Date 04/21/23    Referring Provider Rozell Searing Custovic, DO      Oxygen   Maintain Oxygen Saturation 88% or higher      Treadmill   MPH 2.3    Grade 1    Minutes 15    METs 3.08      NuStep   Level 3    SPM 80    Minutes 15    METs 3.43      REL-XR   Level 3    Speed 50    Minutes 15    METs 3.43      Prescription Details   Frequency (times per week) 3    Duration Progress to 30 minutes of continuous aerobic without signs/symptoms of physical distress      Intensity   THRR 40-80% of Max Heartrate 108-146    Ratings of Perceived Exertion 11-13    Perceived Dyspnea 0-4      Progression   Progression Continue to progress workloads to maintain intensity without signs/symptoms of physical distress.      Resistance Training   Training Prescription Yes    Weight 4 lb    Reps 10-15             Exercise Goals: Frequency: Be able to perform aerobic exercise two to three times per week in program working toward 2-5 days per week of home exercise.  Intensity: Work with a perceived exertion of 11 (fairly light) - 15 (hard) while following your exercise prescription.  We will make changes to your prescription with you as you progress through the program.   Duration: Be able to do 30 to 45 minutes of continuous aerobic exercise in addition to a 5 minute warm-up and a 5 minute cool-down routine.   Nutrition Goals: Your personal nutrition goals will be  established when you do your nutrition analysis with the dietician.  The following are general nutrition guidelines to follow: Cholesterol < 200mg /day Sodium < 1500mg /day Fiber: Women over 50 yrs - 21 grams per day  Personal Goals:  Personal Goals and Risk Factors at Admission - 04/20/23 1504       Core Components/Risk Factors/Patient Goals on Admission    Weight Management Yes;Weight Loss    Intervention Weight Management: Develop a combined nutrition and exercise program designed to reach desired caloric intake, while maintaining appropriate intake of nutrient and fiber, sodium and fats, and appropriate energy expenditure required for the weight goal.;Weight Management: Provide education and appropriate resources to help participant work on and attain dietary goals.;Weight Management/Obesity: Establish reasonable short term and long term weight goals.    Admit Weight 163 lb (73.9 kg)    Expected Outcomes Long Term: Adherence to nutrition and physical activity/exercise program aimed toward attainment of established weight goal;Short Term: Continue to assess and modify interventions until short term weight is achieved;Weight Loss: Understanding of general recommendations for a balanced deficit meal plan, which promotes 1-2 lb weight loss per week and includes a  negative energy balance of [phone] kcal/d;Understanding recommendations for meals to include 15-35% energy as protein, 25-35% energy from fat, 35-60% energy from carbohydrates, less than 200mg  of dietary cholesterol, 20-35 gm of total fiber daily;Understanding of distribution of calorie intake throughout the day with the consumption of 4-5 meals/snacks    Diabetes Yes    Intervention Provide education about signs/symptoms and action to take for hypo/hyperglycemia.;Provide education about proper nutrition, including hydration, and aerobic/resistive exercise prescription along with prescribed medications to achieve blood glucose in normal  ranges: Fasting glucose 65-99 mg/dL    Expected Outcomes Short Term: Participant verbalizes understanding of the signs/symptoms and immediate care of hyper/hypoglycemia, proper foot care and importance of medication, aerobic/resistive exercise and nutrition plan for blood glucose control.;Long Term: Attainment of HbA1C < 7%.    Hypertension Yes    Intervention Provide education on lifestyle modifcations including regular physical activity/exercise, weight management, moderate sodium restriction and increased consumption of fresh fruit, vegetables, and low fat dairy, alcohol moderation, and smoking cessation.;Monitor prescription use compliance.    Expected Outcomes Short Term: Continued assessment and intervention until BP is < 140/72mm HG in hypertensive participants. < 130/47mm HG in hypertensive participants with diabetes, heart failure or chronic kidney disease.;Long Term: Maintenance of blood pressure at goal levels.            Exercise Goals and Review:  Exercise Goals     Row Name 04/21/23 1000             Exercise Goals   Increase Physical Activity Yes       Intervention Provide advice, education, support and counseling about physical activity/exercise needs.;Develop an individualized exercise prescription for aerobic and resistive training based on initial evaluation findings, risk stratification, comorbidities and participant's personal goals.       Expected Outcomes Short Term: Attend rehab on a regular basis to increase amount of physical activity.;Long Term: Add in home exercise to make exercise part of routine and to increase amount of physical activity.;Long Term: Exercising regularly at least 3-5 days a week.       Increase Strength and Stamina Yes       Intervention Provide advice, education, support and counseling about physical activity/exercise needs.;Develop an individualized exercise prescription for aerobic and resistive training based on initial evaluation findings,  risk stratification, comorbidities and participant's personal goals.       Expected Outcomes Short Term: Increase workloads from initial exercise prescription for resistance, speed, and METs.;Long Term: Improve cardiorespiratory fitness, muscular endurance and strength as measured by increased METs and functional capacity ( );Short Term: Perform resistance training exercises routinely during rehab and add in resistance training at home       Able to understand and use rate of perceived exertion (RPE) scale Yes       Intervention Provide education and explanation on how to use RPE scale       Expected Outcomes Short Term: Able to use RPE daily in rehab to express subjective intensity level;Long Term:  Able to use RPE to guide intensity level when exercising independently       Able to understand and use Dyspnea scale Yes       Intervention Provide education and explanation on how to use Dyspnea scale       Expected Outcomes Short Term: Able to use Dyspnea scale daily in rehab to express subjective sense of shortness of breath during exertion;Long Term: Able to use Dyspnea scale to guide intensity level when exercising independently  Knowledge and understanding of Target Heart Rate Range (THRR) Yes       Intervention Provide education and explanation of THRR including how the numbers were predicted and where they are located for reference       Expected Outcomes Short Term: Able to state/look up THRR;Long Term: Able to use THRR to govern intensity when exercising independently;Short Term: Able to use daily as guideline for intensity in rehab       Able to check pulse independently Yes       Intervention Review the importance of being able to check your own pulse for safety during independent exercise;Provide education and demonstration on how to check pulse in carotid and radial arteries.       Expected Outcomes Short Term: Able to explain why pulse checking is important during independent  exercise;Long Term: Able to check pulse independently and accurately       Understanding of Exercise Prescription Yes       Intervention Provide education, explanation, and written materials on patient's individual exercise prescription       Expected Outcomes Long Term: Able to explain home exercise prescription to exercise independently;Short Term: Able to explain program exercise prescription

## 2023-04-26 ENCOUNTER — Encounter: Attending: Internal Medicine | Admitting: *Deleted

## 2023-04-26 DIAGNOSIS — I252 Old myocardial infarction: Secondary | ICD-10-CM | POA: Insufficient documentation

## 2023-04-26 DIAGNOSIS — Z955 Presence of coronary angioplasty implant and graft: Secondary | ICD-10-CM | POA: Insufficient documentation

## 2023-04-26 DIAGNOSIS — Z48812 Encounter for surgical aftercare following surgery on the circulatory system: Secondary | ICD-10-CM | POA: Insufficient documentation

## 2023-04-26 DIAGNOSIS — I213 ST elevation (STEMI) myocardial infarction of unspecified site: Secondary | ICD-10-CM

## 2023-04-26 LAB — GLUCOSE, CAPILLARY
Glucose-Capillary: 293 mg/dL — ABNORMAL HIGH (ref 70–99)
Glucose-Capillary: 270 mg/dL — ABNORMAL HIGH (ref 70–99)

## 2023-04-26 NOTE — Progress Notes (Signed)
 Daily Session Note  Patient Details  Name: Erica Ayala MRN: [id number] Date of Birth: 1965/04/11 Referring Provider:   Flowsheet Row Cardiac Rehab from 04/21/2023 in [facility] Cardiac and Pulmonary Rehab  Referring Provider Clotilde Dieter, DO       Encounter Date: 04/26/2023  Check In:  Session Check In - 04/26/23 0749       Check-In   Supervising physician immediately available to respond to emergencies See telemetry face sheet for immediately available ER MD    Location [facility]-Cardiac & Pulmonary Rehab    Staff Present Susann Givens RN,BSN;[facility] Madilyn Fireman BS, ACSM CEP, Exercise Physiologist    Virtual Visit No    Medication changes reported     No    Fall or balance concerns reported    No    Warm-up and Cool-down Performed on first and last piece of equipment    Resistance Training Performed Yes    VAD Patient? No    PAD/SET Patient? No      Pain Assessment   Currently in Pain? No/denies                Social History   Tobacco Use  Smoking Status Never  Smokeless Tobacco Never    Goals Met:  Independence with exercise equipment Exercise tolerated well No report of concerns or symptoms today Strength training completed today  Goals Unmet:  Not Applicable  Comments: First full day of exercise!  Patient was oriented to gym and equipment including functions, settings, policies, and procedures.  Patient's individual exercise prescription and treatment plan were reviewed.  All starting workloads were established based on the results of the 6 minute walk test done at initial orientation visit.  The plan for exercise progression was also introduced and progression will be customized based on patient's performance and goals.    Dr. Bethann Punches is Medical Director for [facility] Cardiac Rehabilitation.  Dr. Vida Rigger is Medical Director for [facility] Pulmonary Rehabilitation.

## 2023-04-28 ENCOUNTER — Encounter

## 2023-04-30 ENCOUNTER — Encounter

## 2023-04-30 DIAGNOSIS — Z955 Presence of coronary angioplasty implant and graft: Secondary | ICD-10-CM | POA: Diagnosis not present

## 2023-04-30 DIAGNOSIS — I213 ST elevation (STEMI) myocardial infarction of unspecified site: Secondary | ICD-10-CM

## 2023-04-30 LAB — GLUCOSE, CAPILLARY
Glucose-Capillary: 243 mg/dL — ABNORMAL HIGH (ref 70–99)
Glucose-Capillary: 231 mg/dL — ABNORMAL HIGH (ref 70–99)

## 2023-04-30 NOTE — Progress Notes (Signed)
 Daily Session Note  Patient Details  Name: Erica Ayala MRN: [id number] Date of Birth: 02/16/1966 Referring Provider:   Flowsheet Row Cardiac Rehab from 04/21/2023 in [facility] Cardiac and Pulmonary Rehab  Referring Provider Clotilde Dieter, DO       Encounter Date: 04/30/2023  Check In:  Session Check In - 04/30/23 0732       Check-In   Supervising physician immediately available to respond to emergencies See telemetry face sheet for immediately available ER MD    Location [facility]-Cardiac & Pulmonary Rehab    Staff Present Kelton Pillar RN,BSN,MPA;Noah Tickle, BS, Exercise Physiologist;Joseph Hollace Kinnier    Virtual Visit No    Medication changes reported     No    Fall or balance concerns reported    No    Warm-up and Cool-down Performed on first and last piece of equipment    Resistance Training Performed Yes    VAD Patient? No    PAD/SET Patient? No      Pain Assessment   Currently in Pain? No/denies                Social History   Tobacco Use  Smoking Status Never  Smokeless Tobacco Never    Goals Met:  Independence with exercise equipment Exercise tolerated well No report of concerns or symptoms today Strength training completed today  Goals Unmet:  Not Applicable  Comments: Pt able to follow exercise prescription today without complaint.  Will continue to monitor for progression.    Dr. Bethann Punches is Medical Director for [facility] Cardiac Rehabilitation.  Dr. Vida Rigger is Medical Director for [facility] Pulmonary Rehabilitation.

## 2023-05-03 ENCOUNTER — Encounter: Admitting: *Deleted

## 2023-05-03 DIAGNOSIS — I213 ST elevation (STEMI) myocardial infarction of unspecified site: Secondary | ICD-10-CM

## 2023-05-03 DIAGNOSIS — Z955 Presence of coronary angioplasty implant and graft: Secondary | ICD-10-CM | POA: Diagnosis not present

## 2023-05-03 NOTE — Progress Notes (Signed)
 Daily Session Note  Patient Details  Name: Erica Ayala MRN: [id number] Date of Birth: [birth year] Referring Provider:   Flowsheet Row Cardiac Rehab from 04/21/2023 in [facility] Cardiac and Pulmonary Rehab  Referring Provider Clotilde Dieter, DO       Encounter Date: 05/03/2023  Check In:  Session Check In - 05/03/23 0755       Check-In   Supervising physician immediately available to respond to emergencies See telemetry face sheet for immediately available ER MD    Location [facility]-Cardiac & Pulmonary Rehab    Staff Present Cora Collum, RN, BSN, CCRP;Joseph Hood RCP,RRT,BSRT;Kelly Manson BS, ACSM CEP, Exercise Physiologist    Virtual Visit No    Medication changes reported     No    Fall or balance concerns reported    No    Warm-up and Cool-down Performed on first and last piece of equipment    Resistance Training Performed Yes    VAD Patient? No    PAD/SET Patient? No      Pain Assessment   Currently in Pain? No/denies                Social History   Tobacco Use  Smoking Status Never  Smokeless Tobacco Never    Goals Met:  Independence with exercise equipment Exercise tolerated well No report of concerns or symptoms today  Goals Unmet:  Not Applicable  Comments: Pt able to follow exercise prescription today without complaint.  Will continue to monitor for progression.    Dr. Bethann Punches is Medical Director for [facility] Cardiac Rehabilitation.  Dr. Vida Rigger is Medical Director for [facility] Pulmonary Rehabilitation.

## 2023-05-05 ENCOUNTER — Encounter: Payer: Self-pay | Admitting: *Deleted

## 2023-05-05 ENCOUNTER — Encounter: Admitting: *Deleted

## 2023-05-05 DIAGNOSIS — Z955 Presence of coronary angioplasty implant and graft: Secondary | ICD-10-CM

## 2023-05-05 DIAGNOSIS — I213 ST elevation (STEMI) myocardial infarction of unspecified site: Secondary | ICD-10-CM

## 2023-05-05 NOTE — Progress Notes (Signed)
 Daily Session Note  Patient Details  Name: Erica Ayala MRN: [id number] Date of Birth: 1965/06/28 Referring Provider:   Flowsheet Row Cardiac Rehab from 04/21/2023 in [facility] Cardiac and Pulmonary Rehab  Referring Provider Clotilde Dieter, DO       Encounter Date: 05/05/2023  Check In:  Session Check In - 05/05/23 0818       Check-In   Supervising physician immediately available to respond to emergencies See telemetry face sheet for immediately available ER MD    Staff Present Cora Collum, RN, BSN, CCRP;Margaret Best, MS, Exercise Physiologist;Maxon Conetta BS, Exercise Physiologist;Joseph Hood RCP,RRT,BSRT;Noah Tickle, BS, Exercise Physiologist    Virtual Visit No    Medication changes reported     No    Fall or balance concerns reported    No    Warm-up and Cool-down Performed on first and last piece of equipment    Resistance Training Performed Yes    VAD Patient? No    PAD/SET Patient? No      Pain Assessment   Currently in Pain? No/denies                Social History   Tobacco Use  Smoking Status Never  Smokeless Tobacco Never    Goals Met:  Independence with exercise equipment Exercise tolerated well No report of concerns or symptoms today  Goals Unmet:  Not Applicable  Comments: Pt able to follow exercise prescription today without complaint.  Will continue to monitor for progression.    Dr. Bethann Punches is Medical Director for [facility] Cardiac Rehabilitation.  Dr. Vida Rigger is Medical Director for [facility] Pulmonary Rehabilitation.

## 2023-05-05 NOTE — Progress Notes (Signed)
 Cardiac Individual Treatment Plan  Patient Details  Name: Erica Ayala MRN: [id number] Date of Birth: 04-30-1965 Referring Provider:   Flowsheet Row Cardiac Rehab from 04/21/2023 in [facility] Cardiac and Pulmonary Rehab  Referring Provider Clotilde Dieter, DO       Initial Encounter Date:  Flowsheet Row Cardiac Rehab from 04/21/2023 in [facility] Cardiac and Pulmonary Rehab  Date 04/21/23       Visit Diagnosis: ST elevation myocardial infarction (STEMI), unspecified artery (HCC)  Status post coronary artery stent placement  Patient's Home Medications on Admission:  Current Outpatient Medications:    albuterol (PROVENTIL HFA;VENTOLIN HFA) 108 (90 Base) MCG/ACT inhaler, Inhale 2 puffs into the lungs every 4 (four) hours as needed., Disp: , Rfl:    aspirin 81 MG chewable tablet, Chew 81 mg by mouth daily., Disp: , Rfl:    atorvastatin (LIPITOR) 80 MG tablet, Take 80 mg by mouth daily., Disp: , Rfl:    canagliflozin (INVOKANA) 100 MG TABS tablet, Take 100 mg by mouth daily before breakfast., Disp: , Rfl:    cetirizine (ZYRTEC) 10 MG tablet, Take 10 mg by mouth daily., Disp: , Rfl:    Cholecalciferol (D 1000) 25 MCG (1000 UT) capsule, Take 1,000 Units by mouth daily., Disp: , Rfl:    Cyanocobalamin (VITAMIN B-12 PO), Take 1 tablet by mouth daily., Disp: , Rfl:    DULoxetine (CYMBALTA) 60 MG capsule, Take 20 mg by mouth daily. Patient taking 20 mg once daily, Disp: , Rfl:    gabapentin (NEURONTIN) 300 MG capsule, Take 300 mg by mouth at bedtime. (Patient not taking: Reported on 04/20/2023), Disp: , Rfl:    glipiZIDE (GLUCOTROL XL) 5 MG 24 hr tablet, Take 10 mg by mouth daily., Disp: , Rfl:    Insulin Glargine Solostar (LANTUS) 100 UNIT/ML Solostar Pen, Inject 15 Units into the skin at bedtime., Disp: , Rfl:    lisinopril-hydrochlorothiazide (PRINZIDE,ZESTORETIC) 10-12.5 MG tablet, Take 1 tablet by mouth daily. (Patient not taking: Reported on 04/20/2023), Disp: , Rfl:    metFORMIN (GLUCOPHAGE-XR)  500 MG 24 hr tablet, Take 1,000 mg by mouth daily with supper. (Patient not taking: Reported on 04/20/2023), Disp: , Rfl:    metoprolol succinate (TOPROL-XL) 25 MG 24 hr tablet, Take 1 tablet by mouth daily., Disp: , Rfl:    Multiple Vitamin (MULTIVITAMIN) tablet, Take 1 tablet by mouth daily., Disp: , Rfl:    nitroGLYCERIN (NITRODUR - DOSED IN MG/24 HR) 0.4 mg/hr patch, Place 0.4 mg onto the skin daily., Disp: , Rfl:    pantoprazole (PROTONIX) 20 MG tablet, Take 20 mg by mouth daily. (Patient not taking: Reported on 04/20/2023), Disp: , Rfl:    progesterone (PROMETRIUM) 200 MG capsule, Take 200 mg by mouth daily., Disp: , Rfl:    ticagrelor (BRILINTA) 90 MG TABS tablet, Take 90 mg by mouth 2 (two) times daily., Disp: , Rfl:   Past Medical History: Past Medical History:  Diagnosis Date   Diabetes mellitus without complication (HCC)    Factor 5 Leiden mutation, heterozygous (HCC)    Hypertension     Tobacco Use: Social History   Tobacco Use  Smoking Status Never  Smokeless Tobacco Never    Labs: Review Flowsheet        No data to display           Exercise Target Goals: Exercise Program Goal: Individual exercise prescription set using results from initial 6 min walk test and THRR while considering  patient's activity barriers and safety.  Exercise Prescription Goal: Initial exercise prescription builds to 30-45 minutes a day of aerobic activity, 2-3 days per week.  Home exercise guidelines will be given to patient during program as part of exercise prescription that the participant will acknowledge.   Education: Aerobic Exercise: - Group verbal and visual presentation on the components of exercise prescription. Introduces F.I.T.T principle from ACSM for exercise prescriptions.  Reviews F.I.T.T. principles of aerobic exercise including progression. Written material given at graduation.   Education: Resistance Exercise: - Group verbal and visual presentation on the  components of exercise prescription. Introduces F.I.T.T principle from ACSM for exercise prescriptions  Reviews F.I.T.T. principles of resistance exercise including progression. Written material given at graduation.    Education: Exercise & Equipment Safety: - Individual verbal instruction and demonstration of equipment use and safety with use of the equipment. Flowsheet Row Cardiac Rehab from 05/05/2023 in [facility] Cardiac and Pulmonary Rehab  Date 04/21/23  Educator NT  Instruction Review Code 1- Verbalizes Understanding       Education: Exercise Physiology & General Exercise Guidelines: - Group verbal and written instruction with models to review the exercise physiology of the cardiovascular system and associated critical values. Provides general exercise guidelines with specific guidelines to those with heart or lung disease.    Education: Flexibility, Balance, Mind/Body Relaxation: - Group verbal and visual presentation with interactive activity on the components of exercise prescription. Introduces F.I.T.T principle from ACSM for exercise prescriptions. Reviews F.I.T.T. principles of flexibility and balance exercise training including progression. Also discusses the mind body connection.  Reviews various relaxation techniques to help reduce and manage stress (i.e. Deep breathing, progressive muscle relaxation, and visualization). Balance handout provided to take home. Written material given at graduation. Flowsheet Row Cardiac Rehab from 05/05/2023 in [facility] Cardiac and Pulmonary Rehab  Date 05/05/23  Educator NT  Instruction Review Code 1- Verbalizes Understanding       Activity Barriers & Risk Stratification:  Activity Barriers & Cardiac Risk Stratification - 04/20/23 1503       Activity Barriers & Cardiac Risk Stratification   Activity Barriers Back Problems;Other (comment)    Comments neuropathy    Cardiac Risk Stratification Moderate             6 Minute Walk:  6 Minute  Walk     Row Name 04/21/23 1001         6 Minute Walk   Phase Initial     Distance 1240 feet     Walk Time 6 minutes     # of Rest Breaks 0     MPH 2.35     METS 3.43     RPE 9     Perceived Dyspnea  0     VO2 Peak 12.01     Symptoms Yes (comment)     Comments Feet tingling     Resting HR 71 bpm     Resting BP 112/64     Resting Oxygen Saturation  97 %     Exercise Oxygen Saturation  during 6 min walk 99 %     Max Ex. HR 93 bpm     Max Ex. BP 128/66     2 Minute Post BP 116/64              Oxygen Initial Assessment:   Oxygen Re-Evaluation:   Oxygen Discharge (Final Oxygen Re-Evaluation):   Initial Exercise Prescription:  Initial Exercise Prescription - 04/21/23 1000       Date of Initial Exercise RX  and Referring Provider   Date 04/21/23    Referring Provider Rozell Searing Custovic, DO      Oxygen   Maintain Oxygen Saturation 88% or higher      Treadmill   MPH 2.3    Grade 1    Minutes 15    METs 3.08      NuStep   Level 3    SPM 80    Minutes 15    METs 3.43      REL-XR   Level 3    Speed 50    Minutes 15    METs 3.43      Prescription Details   Frequency (times per week) 3    Duration Progress to 30 minutes of continuous aerobic without signs/symptoms of physical distress      Intensity   THRR 40-80% of Max Heartrate 108-146    Ratings of Perceived Exertion 11-13    Perceived Dyspnea 0-4      Progression   Progression Continue to progress workloads to maintain intensity without signs/symptoms of physical distress.      Resistance Training   Training Prescription Yes    Weight 4 lb    Reps 10-15             Perform Capillary Blood Glucose checks as needed.  Exercise Prescription Changes:   Exercise Prescription Changes     Row Name 04/21/23 1000             Response to Exercise   Blood Pressure (Admit) 112/64       Blood Pressure (Exercise) 128/66       Blood Pressure (Exit) 116/64       Heart Rate (Admit) 71 bpm        Heart Rate (Exercise) 93 bpm       Heart Rate (Exit) 76 bpm       Oxygen Saturation (Admit) 97 %       Oxygen Saturation (Exercise) 99 %       Rating of Perceived Exertion (Exercise) 9       Perceived Dyspnea (Exercise) 0       Symptoms feet tingling       Comments Results                Exercise Comments:   Exercise Comments     Row Name 04/26/23 0751           Exercise Comments First full day of exercise!  Patient was oriented to gym and equipment including functions, settings, policies, and procedures.  Patient's individual exercise prescription and treatment plan were reviewed.  All starting workloads were established based on the results of the 6 minute walk test done at initial orientation visit.  The plan for exercise progression was also introduced and progression will be customized based on patient's performance and goals.                Exercise Goals and Review:   Exercise Goals     Row Name 04/21/23 1000             Exercise Goals   Increase Physical Activity Yes       Intervention Provide advice, education, support and counseling about physical activity/exercise needs.;Develop an individualized exercise prescription for aerobic and resistive training based on initial evaluation findings, risk stratification, comorbidities and participant's personal goals.       Expected Outcomes Short Term: Attend rehab on a regular basis to increase amount of physical  activity.;Long Term: Add in home exercise to make exercise part of routine and to increase amount of physical activity.;Long Term: Exercising regularly at least 3-5 days a week.       Increase Strength and Stamina Yes       Intervention Provide advice, education, support and counseling about physical activity/exercise needs.;Develop an individualized exercise prescription for aerobic and resistive training based on initial evaluation findings, risk stratification, comorbidities and participant's  personal goals.       Expected Outcomes Short Term: Increase workloads from initial exercise prescription for resistance, speed, and METs.;Long Term: Improve cardiorespiratory fitness, muscular endurance and strength as measured by increased METs and functional capacity ( );Short Term: Perform resistance training exercises routinely during rehab and add in resistance training at home       Able to understand and use rate of perceived exertion (RPE) scale Yes       Intervention Provide education and explanation on how to use RPE scale       Expected Outcomes Short Term: Able to use RPE daily in rehab to express subjective intensity level;Long Term:  Able to use RPE to guide intensity level when exercising independently       Able to understand and use Dyspnea scale Yes       Intervention Provide education and explanation on how to use Dyspnea scale       Expected Outcomes Short Term: Able to use Dyspnea scale daily in rehab to express subjective sense of shortness of breath during exertion;Long Term: Able to use Dyspnea scale to guide intensity level when exercising independently       Knowledge and understanding of Target Heart Rate Range (THRR) Yes       Intervention Provide education and explanation of THRR including how the numbers were predicted and where they are located for reference       Expected Outcomes Short Term: Able to state/look up THRR;Long Term: Able to use THRR to govern intensity when exercising independently;Short Term: Able to use daily as guideline for intensity in rehab       Able to check pulse independently Yes       Intervention Review the importance of being able to check your own pulse for safety during independent exercise;Provide education and demonstration on how to check pulse in carotid and radial arteries.       Expected Outcomes Short Term: Able to explain why pulse checking is important during independent exercise;Long Term: Able to check pulse independently and  accurately       Understanding of Exercise Prescription Yes       Intervention Provide education, explanation, and written materials on patient's individual exercise prescription       Expected Outcomes Long Term: Able to explain home exercise prescription to exercise independently;Short Term: Able to explain program exercise prescription                Exercise Goals Re-Evaluation :  Exercise Goals Re-Evaluation     Row Name 04/26/23 0751             Exercise Goal Re-Evaluation   Exercise Goals Review Increase Physical Activity;Able to understand and use rate of perceived exertion (RPE) scale;Knowledge and understanding of Target Heart Rate Range (THRR);Understanding of Exercise Prescription;Increase Strength and Stamina;Able to check pulse independently       Comments Reviewed RPE and dyspnea scale, THR and program prescription with pt today.  Pt voiced understanding and was given a copy of goals to take home.  Expected Outcomes Short: Use RPE daily to regulate intensity.  Long: Follow program prescription in THR.                Discharge Exercise Prescription (Final Exercise Prescription Changes):  Exercise Prescription Changes - 04/21/23 1000       Response to Exercise   Blood Pressure (Admit) 112/64    Blood Pressure (Exercise) 128/66    Blood Pressure (Exit) 116/64    Heart Rate (Admit) 71 bpm    Heart Rate (Exercise) 93 bpm    Heart Rate (Exit) 76 bpm    Oxygen Saturation (Admit) 97 %    Oxygen Saturation (Exercise) 99 %    Rating of Perceived Exertion (Exercise) 9    Perceived Dyspnea (Exercise) 0    Symptoms feet tingling    Comments Results             Nutrition:  Target Goals: Understanding of nutrition guidelines, daily intake of sodium 1500mg , cholesterol 200mg , calories 30% from fat and 7% or less from saturated fats, daily to have 5 or more servings of fruits and vegetables.  Education: All About Nutrition: -Group instruction  provided by verbal, written material, interactive activities, discussions, models, and posters to present general guidelines for heart healthy nutrition including fat, fiber, MyPlate, the role of sodium in heart healthy nutrition, utilization of the nutrition label, and utilization of this knowledge for meal planning. Follow up email sent as well. Written material given at graduation. Flowsheet Row Cardiac Rehab from 05/05/2023 in [facility] Cardiac and Pulmonary Rehab  Education need identified 04/21/23       Biometrics:  Pre Biometrics - 04/21/23 1001       Pre Biometrics   Height 5' 4.75" (1.645 m)    Weight 169 lb 8 oz (76.9 kg)    Waist Circumference 37 inches    Hip Circumference 39 inches    Waist to Hip Ratio 0.95 %    BMI (Calculated) 28.41    Single Leg Stand 5.2 seconds              Nutrition Therapy Plan and Nutrition Goals:  Nutrition Therapy & Goals - 04/21/23 1104       Nutrition Therapy   Diet Carb controlled    Protein (specify units) 90    Fiber 25 grams    Whole Grain Foods 3 servings    Saturated Fats 15 max. grams    Fruits and Vegetables 5 servings/day    Sodium 2 grams      Personal Nutrition Goals   Nutrition Goal Eat 15-30gProtein and 30-60gCarbs at each meal.    Personal Goal #2 Read labels and reduce sodium intake to below 2300mg . Ideally 1500mg  per day.    Personal Goal #3 Reduce saturated fat, less than 12g per day. Replace bad fats for more heart healthy fats.    Comments Patient is not drinking sugary beverages. She and her mother have DM, so she is carb conscious. Tries to pick complex carbs and keep them controlled. She was not aware of how many carbs to eat at each meal. Educated on carb goal of 30-60g per meal, with snacks roughly half as much. Her A1C was 11% 2 weeks ago, which was alarming. Spoke with her, she reports her life became stressful with several family members in and out of the hospital. She also reports her Dr placed her on a  different medication for her DM. Encouraged her to communicate with Dr about her blood sugars,  and to continue to make good carb choices. Educated on [employer], types of fats, sources, and how to read labels. Went over sodium and its effects on blood pressure and heart health. Build out several small meals and snacks that are easier to make for when life gets suddenly busy.      Intervention Plan   Intervention Prescribe, educate and counsel regarding individualized specific dietary modifications aiming towards targeted core components such as weight, hypertension, lipid management, diabetes, heart failure and other comorbidities.;Nutrition handout(s) given to patient.    Expected Outcomes Short Term Goal: Understand basic principles of dietary content, such as calories, fat, sodium, cholesterol and nutrients.;Short Term Goal: A plan has been developed with personal nutrition goals set during dietitian appointment.;Long Term Goal: Adherence to prescribed nutrition plan.             Nutrition Assessments:  MEDIFICTS Score Key: >=70 Need to make dietary changes  40-70 Heart Healthy Diet <= 40 Therapeutic Level Cholesterol Diet  Flowsheet Row Cardiac Rehab from 04/21/2023 in [facility] Cardiac and Pulmonary Rehab  Picture Your Plate Total Score on Admission 68      Picture Your Plate Scores: <96 Unhealthy dietary pattern with much room for improvement. 41-50 Dietary pattern unlikely to meet recommendations for good health and room for improvement. 51-60 More healthful dietary pattern, with some room for improvement.  >60 Healthy dietary pattern, although there may be some specific behaviors that could be improved.    Nutrition Goals Re-Evaluation:   Nutrition Goals Discharge (Final Nutrition Goals Re-Evaluation):   Psychosocial: Target Goals: Acknowledge presence or absence of significant depression and/or stress, maximize coping skills, provide positive support system.  Participant is able to verbalize types and ability to use techniques and skills needed for reducing stress and depression.   Education: Stress, Anxiety, and Depression - Group verbal and visual presentation to define topics covered.  Reviews how body is impacted by stress, anxiety, and depression.  Also discusses healthy ways to reduce stress and to treat/manage anxiety and depression.  Written material given at graduation.   Education: Sleep Hygiene -Provides group verbal and written instruction about how sleep can affect your health.  Define sleep hygiene, discuss sleep cycles and impact of sleep habits. Review good sleep hygiene tips.    Initial Review & Psychosocial Screening:  Initial Psych Review & Screening - 04/20/23 1511       Initial Review   Current issues with Current Sleep Concerns;Current Stress Concerns    Source of Stress Concerns Family      Family Dynamics   Good Support System? Yes   husband, family     Barriers   Psychosocial barriers to participate in program There are no identifiable barriers or psychosocial needs.;The patient should benefit from training in stress management and relaxation.      Screening Interventions   Interventions Encouraged to exercise;Provide feedback about the scores to participant;To provide support and resources with identified psychosocial needs    Expected Outcomes Short Term goal: Utilizing psychosocial counselor, staff and physician to assist with identification of specific Stressors or current issues interfering with healing process. Setting desired goal for each stressor or current issue identified.;Long Term Goal: Stressors or current issues are controlled or eliminated.;Short Term goal: Identification and review with participant of any Quality of Life or Depression concerns found by scoring the questionnaire.;Long Term goal: The participant improves quality of Life and PHQ9 Scores as seen by post scores and/or verbalization of changes  Quality of Life Scores:   Quality of Life - 04/21/23 0958       Quality of Life   Select Quality of Life      Quality of Life Scores   Health/Function Pre 16.4 %    Socioeconomic Pre 23.63 %    Psych/Spiritual Pre 28.29 %    Family Pre 25.2 %    GLOBAL Pre 21.69 %            Scores of 19 and below usually indicate a poorer quality of life in these areas.  A difference of  2-3 points is a clinically meaningful difference.  A difference of 2-3 points in the total score of the Quality of Life Index has been associated with significant improvement in overall quality of life, self-image, physical symptoms, and general health in studies assessing change in quality of life.  PHQ-9: Review Flowsheet       04/21/2023 02/27/2016  Depression screen PHQ 2/9  Decreased Interest 1 0  Down, Depressed, Hopeless 1 1  PHQ - 2 Score 2 1  Altered sleeping 2 -  Tired, decreased energy 3 -  Change in appetite 0 -  Feeling bad or failure about yourself  0 -  Trouble concentrating 1 -  Moving slowly or fidgety/restless 0 -  Suicidal thoughts 0 -  PHQ-9 Score 8 -  Difficult doing work/chores Somewhat difficult -   Interpretation of Total Score  Total Score Depression Severity:  1-4 = Minimal depression, 5-9 = Mild depression, 10-14 = Moderate depression, 15-19 = Moderately severe depression, 20-27 = Severe depression   Psychosocial Evaluation and Intervention:  Psychosocial Evaluation - 04/20/23 1521       Psychosocial Evaluation & Interventions   Interventions Encouraged to exercise with the program and follow exercise prescription;Stress management education;Relaxation education    Comments Geralene is 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 She had her MI while visiting her hospitalized father. She is helping care for her dad and her mother. Her husband and her also help care for her mother in law who had a stroke soon after Keylie's father-in-law passed  away in October. In addition to her parents and in law stress, she also mentions she helps with her husband who is a 100% disabled veteran who struggles with PTSD. 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 Today they also had to put down their family dog. She admits that this all has been a tremendous amount of stress for her and she knows she has to take care of herself. Her duloxetine is helping and she is trying lifestyle changes as well.  She is looking forward to the classes and developing a consistent exercise routine. She is very thankful for her support system and ability to recover.    Expected Outcomes Short: attend cardiac rehab for education and exercise. Long: develop and maintain positive self care habits    Continue Psychosocial Services  Follow up required by staff             Psychosocial Re-Evaluation:   Psychosocial Discharge (Final Psychosocial Re-Evaluation):   Vocational Rehabilitation: Provide vocational rehab assistance to qualifying candidates.   Vocational Rehab Evaluation & Intervention:  Vocational Rehab - 04/20/23 1510       Initial Vocational Rehab Evaluation & Intervention   Assessment shows need for Vocational Rehabilitation No  Education: Education Goals: Education classes will be provided on a variety of topics geared toward better understanding of heart health and risk factor modification. Participant will state understanding/return demonstration of topics presented as noted by education test scores.  Learning Barriers/Preferences:  Learning Barriers/Preferences - 04/20/23 1510       Learning Barriers/Preferences   Learning Barriers None    Learning Preferences None             General Cardiac Education Topics:  AED/CPR: - Group verbal and written instruction with the use of models to demonstrate the basic use of the AED with the basic ABC's of  resuscitation.   Anatomy and Cardiac Procedures: - Group verbal and visual presentation and models provide information about basic cardiac anatomy and function. Reviews the testing methods done to diagnose heart disease and the outcomes of the test results. Describes the treatment choices: Medical Management, Angioplasty, or Coronary Bypass Surgery for treating various heart conditions including Myocardial Infarction, Angina, Valve Disease, and Cardiac Arrhythmias.  Written material given at graduation. Flowsheet Row Cardiac Rehab from 05/05/2023 in [facility] Cardiac and Pulmonary Rehab  Education need identified 04/21/23       Medication Safety: - Group verbal and visual instruction to review commonly prescribed medications for heart and lung disease. Reviews the medication, class of the drug, and side effects. Includes the steps to properly store meds and maintain the prescription regimen.  Written material given at graduation.   Intimacy: - Group verbal instruction through game format to discuss how heart and lung disease can affect sexual intimacy. Written material given at graduation..   Know Your Numbers and Heart Failure: - Group verbal and visual instruction to discuss disease risk factors for cardiac and pulmonary disease and treatment options.  Reviews associated critical values for Overweight/Obesity, Hypertension, Cholesterol, and Diabetes.  Discusses basics of heart failure: signs/symptoms and treatments.  Introduces Heart Failure Zone chart for action plan for heart failure.  Written material given at graduation.   Infection Prevention: - Provides verbal and written material to individual with discussion of infection control including proper hand washing and proper equipment cleaning during exercise session. Flowsheet Row Cardiac Rehab from 05/05/2023 in [facility] Cardiac and Pulmonary Rehab  Date 04/21/23  Educator NT  Instruction Review Code 1- Verbalizes Understanding        Falls Prevention: - Provides verbal and written material to individual with discussion of falls prevention and safety. Flowsheet Row Cardiac Rehab from 05/05/2023 in [facility] Cardiac and Pulmonary Rehab  Date 04/21/23  Educator NT  Instruction Review Code 1- Verbalizes Understanding       Other: -Provides group and verbal instruction on various topics (see comments)   Knowledge Questionnaire Score:  Knowledge Questionnaire Score - 04/21/23 0272       Knowledge Questionnaire Score   Pre Score 24/26             Core Components/Risk Factors/Patient Goals at Admission:  Personal Goals and Risk Factors at Admission - 04/20/23 1504       Core Components/Risk Factors/Patient Goals on Admission    Weight Management Yes;Weight Loss    Intervention Weight Management: Develop a combined nutrition and exercise program designed to reach desired caloric intake, while maintaining appropriate intake of nutrient and fiber, sodium and fats, and appropriate energy expenditure required for the weight goal.;Weight Management: Provide education and appropriate resources to help participant work on and attain dietary goals.;Weight Management/Obesity: Establish reasonable short term and long term weight goals.    Admit  Weight 163 lb (73.9 kg)    Expected Outcomes Long Term: Adherence to nutrition and physical activity/exercise program aimed toward attainment of established weight goal;Short Term: Continue to assess and modify interventions until short term weight is achieved;Weight Loss: Understanding of general recommendations for a balanced deficit meal plan, which promotes 1-2 lb weight loss per week and includes a negative energy balance of [phone] kcal/d;Understanding recommendations for meals to include 15-35% energy as protein, 25-35% energy from fat, 35-60% energy from carbohydrates, less than 200mg  of dietary cholesterol, 20-35 gm of total fiber daily;Understanding of distribution of calorie  intake throughout the day with the consumption of 4-5 meals/snacks    Diabetes Yes    Intervention Provide education about signs/symptoms and action to take for hypo/hyperglycemia.;Provide education about proper nutrition, including hydration, and aerobic/resistive exercise prescription along with prescribed medications to achieve blood glucose in normal ranges: Fasting glucose 65-99 mg/dL    Expected Outcomes Short Term: Participant verbalizes understanding of the signs/symptoms and immediate care of hyper/hypoglycemia, proper foot care and importance of medication, aerobic/resistive exercise and nutrition plan for blood glucose control.;Long Term: Attainment of HbA1C < 7%.    Hypertension Yes    Intervention Provide education on lifestyle modifcations including regular physical activity/exercise, weight management, moderate sodium restriction and increased consumption of fresh fruit, vegetables, and low fat dairy, alcohol moderation, and smoking cessation.;Monitor prescription use compliance.    Expected Outcomes Short Term: Continued assessment and intervention until BP is < 140/89mm HG in hypertensive participants. < 130/68mm HG in hypertensive participants with diabetes, heart failure or chronic kidney disease.;Long Term: Maintenance of blood pressure at goal levels.             Education:Diabetes - Individual verbal and written instruction to review signs/symptoms of diabetes, desired ranges of glucose level fasting, after meals and with exercise. Acknowledge that pre and post exercise glucose checks will be done for 3 sessions at entry of program. Flowsheet Row Cardiac Rehab from 05/05/2023 in [facility] Cardiac and Pulmonary Rehab  Date 04/20/23  Educator [facility]  Instruction Review Code 1- Verbalizes Understanding       Core Components/Risk Factors/Patient Goals Review:    Core Components/Risk Factors/Patient Goals at Discharge (Final Review):    ITP Comments:  ITP Comments     Row Name  04/20/23 1529 04/21/23 0956 04/26/23 0750 05/05/23 0854     ITP Comments Initial phone call completed. Diagnosis can be found in [facility] 2/21. EP Orientation scheduled for Wednesday 2/26 at 8am. Completed and gym orientation. Initial ITP created and sent for review to Dr. Bethann Punches, Medical Director. First full day of exercise!  Patient was oriented to gym and equipment including functions, settings, policies, and procedures.  Patient's individual exercise prescription and treatment plan were reviewed.  All starting workloads were established based on the results of the 6 minute walk test done at initial orientation visit.  The plan for exercise progression was also introduced and progression will be customized based on patient's performance and goals. 30 Day review completed. Medical Director ITP review done, changes made as directed, and signed approval by Medical Director. new to program             Comments:

## 2023-05-07 ENCOUNTER — Encounter: Admitting: *Deleted

## 2023-05-07 DIAGNOSIS — Z955 Presence of coronary angioplasty implant and graft: Secondary | ICD-10-CM

## 2023-05-07 DIAGNOSIS — I213 ST elevation (STEMI) myocardial infarction of unspecified site: Secondary | ICD-10-CM

## 2023-05-07 NOTE — Progress Notes (Signed)
 Daily Session Note  Patient Details  Name: Erica Ayala MRN: [id number] Date of Birth: 23-Jan-1966 Referring Provider:   Flowsheet Row Cardiac Rehab from 04/21/2023 in [facility] Cardiac and Pulmonary Rehab  Referring Provider Clotilde Dieter, DO       Encounter Date: 05/07/2023  Check In:  Session Check In - 05/07/23 0746       Check-In   Supervising physician immediately available to respond to emergencies See telemetry face sheet for immediately available ER MD    Location [facility]-Cardiac & Pulmonary Rehab    Staff Present Cora Collum, RN, BSN, CCRP;Joseph Hood RCP,RRT,BSRT;Noah Tickle, [place], Exercise Physiologist    Virtual Visit No    Medication changes reported     No    Fall or balance concerns reported    No    Warm-up and Cool-down Performed on first and last piece of equipment    Resistance Training Performed Yes    VAD Patient? No    PAD/SET Patient? No      Pain Assessment   Currently in Pain? No/denies                Social History   Tobacco Use  Smoking Status Never  Smokeless Tobacco Never    Goals Met:  Independence with exercise equipment Exercise tolerated well No report of concerns or symptoms today  Goals Unmet:  Not Applicable  Comments: Pt able to follow exercise prescription today without complaint.  Will continue to monitor for progression.    Dr. Bethann Punches is Medical Director for [facility] Cardiac Rehabilitation.  Dr. Vida Rigger is Medical Director for [facility] Pulmonary Rehabilitation.

## 2023-05-10 ENCOUNTER — Encounter

## 2023-05-12 ENCOUNTER — Encounter: Admitting: *Deleted

## 2023-05-12 DIAGNOSIS — I213 ST elevation (STEMI) myocardial infarction of unspecified site: Secondary | ICD-10-CM

## 2023-05-12 DIAGNOSIS — Z955 Presence of coronary angioplasty implant and graft: Secondary | ICD-10-CM

## 2023-05-12 NOTE — Progress Notes (Signed)
 Daily Session Note  Patient Details  Name: Erica Ayala MRN: [id number] Date of Birth: 01/30/1966 Referring Provider:   Flowsheet Row Cardiac Rehab from 04/21/2023 in [facility] Cardiac and Pulmonary Rehab  Referring Provider Clotilde Dieter, DO       Encounter Date: 05/12/2023  Check In:  Session Check In - 05/12/23 0819       Check-In   Supervising physician immediately available to respond to emergencies See telemetry face sheet for immediately available ER MD    Location [facility]-Cardiac & Pulmonary Rehab    Staff Present Maxon Conetta BS, Exercise Physiologist;Noah Tickle, BS, Exercise Physiologist;Joseph Hood RCP,RRT,BSRT;Cora Collum, RN, BSN, CCRP    Virtual Visit No    Medication changes reported     No    Fall or balance concerns reported    No    Warm-up and Cool-down Performed on first and last piece of equipment    Resistance Training Performed Yes    VAD Patient? No    PAD/SET Patient? No      Pain Assessment   Currently in Pain? No/denies                Social History   Tobacco Use  Smoking Status Never  Smokeless Tobacco Never    Goals Met:  Independence with exercise equipment Exercise tolerated well No report of concerns or symptoms today  Goals Unmet:  Not Applicable  Comments: Pt able to follow exercise prescription today without complaint.  Will continue to monitor for progression.    Dr. Bethann Punches is Medical Director for [facility] Cardiac Rehabilitation.  Dr. Vida Rigger is Medical Director for [facility] Pulmonary Rehabilitation.

## 2023-05-14 ENCOUNTER — Encounter: Admitting: *Deleted

## 2023-05-14 DIAGNOSIS — Z955 Presence of coronary angioplasty implant and graft: Secondary | ICD-10-CM | POA: Diagnosis not present

## 2023-05-14 DIAGNOSIS — I213 ST elevation (STEMI) myocardial infarction of unspecified site: Secondary | ICD-10-CM

## 2023-05-14 NOTE — Progress Notes (Signed)
 Daily Session Note  Patient Details  Name: Erica Ayala MRN: [id number] Date of Birth: 01-Aug-1965 Referring Provider:   Flowsheet Row Cardiac Rehab from 04/21/2023 in [facility] Cardiac and Pulmonary Rehab  Referring Provider Clotilde Dieter, DO       Encounter Date: 05/14/2023  Check In:  Session Check In - 05/14/23 0752       Check-In   Supervising physician immediately available to respond to emergencies See telemetry face sheet for immediately available ER MD    Location [facility]-Cardiac & Pulmonary Rehab    Staff Present Rory Percy, MS, Exercise Physiologist;Eliyah Mcshea, RN, BSN, CCRP;Joseph Hood RCP,RRT,BSRT    Virtual Visit No    Medication changes reported     No    Fall or balance concerns reported    No    Warm-up and Cool-down Performed on first and last piece of equipment    Resistance Training Performed Yes    VAD Patient? No    PAD/SET Patient? No      Pain Assessment   Currently in Pain? No/denies                Social History   Tobacco Use  Smoking Status Never  Smokeless Tobacco Never    Goals Met:  Independence with exercise equipment Exercise tolerated well No report of concerns or symptoms today  Goals Unmet:  Not Applicable  Comments: Pt able to follow exercise prescription today without complaint.  Will continue to monitor for progression.    Dr. Bethann Punches is Medical Director for [facility] Cardiac Rehabilitation.  Dr. Vida Rigger is Medical Director for [facility] Pulmonary Rehabilitation.

## 2023-05-17 ENCOUNTER — Encounter: Admitting: *Deleted

## 2023-05-17 DIAGNOSIS — Z955 Presence of coronary angioplasty implant and graft: Secondary | ICD-10-CM | POA: Diagnosis not present

## 2023-05-17 DIAGNOSIS — I213 ST elevation (STEMI) myocardial infarction of unspecified site: Secondary | ICD-10-CM

## 2023-05-17 NOTE — Progress Notes (Signed)
 Daily Session Note  Patient Details  Name: Erica Ayala MRN: [id number] Date of Birth: 1965/05/18 Referring Provider:   Flowsheet Row Cardiac Rehab from 04/21/2023 in [facility] Cardiac and Pulmonary Rehab  Referring Provider Clotilde Dieter, DO       Encounter Date: 05/17/2023  Check In:  Session Check In - 05/17/23 0754       Check-In   Supervising physician immediately available to respond to emergencies See telemetry face sheet for immediately available ER MD    Location [facility]-Cardiac & Pulmonary Rehab    Staff Present Cora Collum, RN, BSN, CCRP;Joseph Hood RCP,RRT,BSRT;Kelly [place] BS, ACSM CEP, Exercise Physiologist;Jason Wallace [place] RDN,LDN    Virtual Visit No    Medication changes reported     No    Fall or balance concerns reported    No    Warm-up and Cool-down Performed on first and last piece of equipment    Resistance Training Performed Yes    VAD Patient? No    PAD/SET Patient? No      Pain Assessment   Currently in Pain? No/denies                Social History   Tobacco Use  Smoking Status Never  Smokeless Tobacco Never    Goals Met:  Independence with exercise equipment Exercise tolerated well No report of concerns or symptoms today  Goals Unmet:  Not Applicable  Comments: Pt able to follow exercise prescription today without complaint.  Will continue to monitor for progression.    Dr. Bethann Punches is Medical Director for [facility] Cardiac Rehabilitation.  Dr. Vida Rigger is Medical Director for [facility] Pulmonary Rehabilitation.

## 2023-05-19 ENCOUNTER — Encounter: Admitting: *Deleted

## 2023-05-19 DIAGNOSIS — I213 ST elevation (STEMI) myocardial infarction of unspecified site: Secondary | ICD-10-CM

## 2023-05-19 DIAGNOSIS — Z955 Presence of coronary angioplasty implant and graft: Secondary | ICD-10-CM | POA: Diagnosis not present

## 2023-05-19 NOTE — Progress Notes (Signed)
 Daily Session Note  Patient Details  Name: Erica Ayala MRN: [id number] Date of Birth: 1965-10-03 Referring Provider:   Flowsheet Row Cardiac Rehab from 04/21/2023 in [facility] Cardiac and Pulmonary Rehab  Referring Provider Clotilde Dieter, DO       Encounter Date: 05/19/2023  Check In:  Session Check In - 05/19/23 0801       Check-In   Supervising physician immediately available to respond to emergencies See telemetry face sheet for immediately available ER MD    Location [facility]-Cardiac & Pulmonary Rehab    Staff Present Cora Collum, RN, BSN, CCRP;Joseph Hood RCP,RRT,BSRT;Noah Tickle, [place], Exercise Physiologist    Virtual Visit No    Medication changes reported     No    Fall or balance concerns reported    No    Warm-up and Cool-down Performed on first and last piece of equipment    Resistance Training Performed Yes    VAD Patient? No    PAD/SET Patient? No      Pain Assessment   Currently in Pain? No/denies                Social History   Tobacco Use  Smoking Status Never  Smokeless Tobacco Never    Goals Met:  Independence with exercise equipment Exercise tolerated well No report of concerns or symptoms today  Goals Unmet:  Not Applicable  Comments: Pt able to follow exercise prescription today without complaint.  Will continue to monitor for progression.    Dr. Bethann Punches is Medical Director for [facility] Cardiac Rehabilitation.  Dr. Vida Rigger is Medical Director for [facility] Pulmonary Rehabilitation.

## 2023-05-21 ENCOUNTER — Encounter

## 2023-05-24 ENCOUNTER — Encounter: Admitting: *Deleted

## 2023-05-24 DIAGNOSIS — Z955 Presence of coronary angioplasty implant and graft: Secondary | ICD-10-CM | POA: Diagnosis not present

## 2023-05-24 DIAGNOSIS — I213 ST elevation (STEMI) myocardial infarction of unspecified site: Secondary | ICD-10-CM

## 2023-05-24 NOTE — Progress Notes (Signed)
 Daily Session Note  Patient Details  Name: Erica Ayala MRN: [id number] Date of Birth: 02-03-1966 Referring Provider:   Flowsheet Row Cardiac Rehab from 04/21/2023 in [facility] Cardiac and Pulmonary Rehab  Referring Provider Clotilde Dieter, DO       Encounter Date: 05/24/2023  Check In:  Session Check In - 05/24/23 0815       Check-In   Supervising physician immediately available to respond to emergencies See telemetry face sheet for immediately available ER MD    Location [facility]-Cardiac & Pulmonary Rehab    Staff Present Cora Collum, RN, BSN, CCRP;Joseph Hood RCP,RRT,BSRT;Kelly [place] BS, ACSM CEP, Exercise Physiologist;Jason Wallace Cullens RDN,LDN    Virtual Visit No    Medication changes reported     No    Fall or balance concerns reported    No    Warm-up and Cool-down Performed on first and last piece of equipment    Resistance Training Performed Yes    VAD Patient? No    PAD/SET Patient? No      Pain Assessment   Currently in Pain? No/denies                Social History   Tobacco Use  Smoking Status Never  Smokeless Tobacco Never    Goals Met:  Independence with exercise equipment Exercise tolerated well No report of concerns or symptoms today  Goals Unmet:  Not Applicable  Comments: Pt able to follow exercise prescription today without complaint.  Will continue to monitor for progression.    Dr. Bethann Punches is Medical Director for [facility] Cardiac Rehabilitation.  Dr. Vida Rigger is Medical Director for [facility] Pulmonary Rehabilitation.

## 2023-05-25 ENCOUNTER — Other Ambulatory Visit (INDEPENDENT_AMBULATORY_CARE_PROVIDER_SITE_OTHER): Payer: Self-pay | Admitting: Podiatry

## 2023-05-25 DIAGNOSIS — I739 Peripheral vascular disease, unspecified: Secondary | ICD-10-CM

## 2023-05-26 ENCOUNTER — Ambulatory Visit (INDEPENDENT_AMBULATORY_CARE_PROVIDER_SITE_OTHER): Payer: Self-pay

## 2023-05-26 ENCOUNTER — Encounter: Attending: Internal Medicine | Admitting: *Deleted

## 2023-05-26 DIAGNOSIS — Z955 Presence of coronary angioplasty implant and graft: Secondary | ICD-10-CM | POA: Insufficient documentation

## 2023-05-26 DIAGNOSIS — I739 Peripheral vascular disease, unspecified: Secondary | ICD-10-CM

## 2023-05-26 DIAGNOSIS — I213 ST elevation (STEMI) myocardial infarction of unspecified site: Secondary | ICD-10-CM | POA: Insufficient documentation

## 2023-05-26 NOTE — Progress Notes (Signed)
 Daily Session Note  Patient Details  Name: Erica Ayala MRN: [id number] Date of Birth: Apr 26, 1965 Referring Provider:   Flowsheet Row Cardiac Rehab from 04/21/2023 in [facility] Cardiac and Pulmonary Rehab  Referring Provider Clotilde Dieter, DO       Encounter Date: 05/26/2023  Check In:  Session Check In - 05/26/23 0756       Check-In   Supervising physician immediately available to respond to emergencies See telemetry face sheet for immediately available ER MD    Location [facility]-Cardiac & Pulmonary Rehab    Staff Present Cora Collum, RN, BSN, CCRP;Joseph Hood RCP,RRT,BSRT;Maxon [place] BS, Exercise Physiologist;Jason Wallace Cullens RDN,LDN    Virtual Visit No    Medication changes reported     No    Fall or balance concerns reported    No    Warm-up and Cool-down Performed on first and last piece of equipment    Resistance Training Performed Yes    VAD Patient? No    PAD/SET Patient? No      Pain Assessment   Currently in Pain? No/denies                Social History   Tobacco Use  Smoking Status Never  Smokeless Tobacco Never    Goals Met:  Independence with exercise equipment Exercise tolerated well No report of concerns or symptoms today  Goals Unmet:  Not Applicable  Comments: Pt able to follow exercise prescription today without complaint.  Will continue to monitor for progression.    Dr. Bethann Punches is Medical Director for [facility] Cardiac Rehabilitation.  Dr. Vida Rigger is Medical Director for [facility] Pulmonary Rehabilitation.

## 2023-05-28 ENCOUNTER — Encounter

## 2023-05-31 ENCOUNTER — Encounter: Admitting: *Deleted

## 2023-05-31 DIAGNOSIS — I213 ST elevation (STEMI) myocardial infarction of unspecified site: Secondary | ICD-10-CM

## 2023-05-31 DIAGNOSIS — Z955 Presence of coronary angioplasty implant and graft: Secondary | ICD-10-CM

## 2023-05-31 NOTE — Progress Notes (Signed)
 Daily Session Note  Patient Details  Name: HADAS JESSOP MRN: [id number] Date of Birth: [birth year] Referring Provider:   Flowsheet Row Cardiac Rehab from 04/21/2023 in [facility] Cardiac and Pulmonary Rehab  Referring Provider Clotilde Dieter, DO       Encounter Date: 05/31/2023  Check In:  Session Check In - 05/31/23 0820       Check-In   Supervising physician immediately available to respond to emergencies See telemetry face sheet for immediately available ER MD    Location [facility]-Cardiac & Pulmonary Rehab    Staff Present Cora Collum, RN, BSN, CCRP;Joseph Hood RCP,RRT,BSRT;Kelly [place] BS, ACSM CEP, Exercise Physiologist;Jason Wallace Cullens RDN,LDN    Virtual Visit No    Medication changes reported     No    Fall or balance concerns reported    No    Warm-up and Cool-down Performed on first and last piece of equipment    Resistance Training Performed Yes    VAD Patient? No    PAD/SET Patient? No      Pain Assessment   Currently in Pain? No/denies                Social History   Tobacco Use  Smoking Status Never  Smokeless Tobacco Never    Goals Met:  Independence with exercise equipment Exercise tolerated well No report of concerns or symptoms today  Goals Unmet:  Not Applicable  Comments: Pt able to follow exercise prescription today without complaint.  Will continue to monitor for progression.    Dr. Bethann Punches is Medical Director for [facility] Cardiac Rehabilitation.  Dr. Vida Rigger is Medical Director for [facility] Pulmonary Rehabilitation.

## 2023-06-02 ENCOUNTER — Encounter

## 2023-06-02 ENCOUNTER — Encounter: Payer: Self-pay | Admitting: *Deleted

## 2023-06-02 DIAGNOSIS — I213 ST elevation (STEMI) myocardial infarction of unspecified site: Secondary | ICD-10-CM

## 2023-06-02 DIAGNOSIS — Z955 Presence of coronary angioplasty implant and graft: Secondary | ICD-10-CM

## 2023-06-02 NOTE — Progress Notes (Signed)
 Cardiac Individual Treatment Plan  Patient Details  Name: Erica Ayala MRN: [id number] Date of Birth: 27-Mar-1965 Referring Provider:   Flowsheet Row Cardiac Rehab from 04/21/2023 in [facility] Cardiac and Pulmonary Rehab  Referring Provider Clotilde Dieter, DO       Initial Encounter Date:  Flowsheet Row Cardiac Rehab from 04/21/2023 in [facility] Cardiac and Pulmonary Rehab  Date 04/21/23       Visit Diagnosis: ST elevation myocardial infarction (STEMI), unspecified artery (HCC)  Status post coronary artery stent placement  Patient's Home Medications on Admission:  Current Outpatient Medications:    albuterol (PROVENTIL HFA;VENTOLIN HFA) 108 (90 Base) MCG/ACT inhaler, Inhale 2 puffs into the lungs every 4 (four) hours as needed., Disp: , Rfl:    aspirin 81 MG chewable tablet, Chew 81 mg by mouth daily., Disp: , Rfl:    atorvastatin (LIPITOR) 80 MG tablet, Take 80 mg by mouth daily., Disp: , Rfl:    canagliflozin (INVOKANA) 100 MG TABS tablet, Take 100 mg by mouth daily before breakfast., Disp: , Rfl:    cetirizine (ZYRTEC) 10 MG tablet, Take 10 mg by mouth daily., Disp: , Rfl:    Cholecalciferol (D 1000) 25 MCG (1000 UT) capsule, Take 1,000 Units by mouth daily., Disp: , Rfl:    Cyanocobalamin (VITAMIN B-12 PO), Take 1 tablet by mouth daily., Disp: , Rfl:    DULoxetine (CYMBALTA) 60 MG capsule, Take 20 mg by mouth daily. Patient taking 20 mg once daily, Disp: , Rfl:    Estradiol 10 MCG TABS vaginal tablet, Place 1 tablet vaginally 2 (two) times a week., Disp: , Rfl:    gabapentin (NEURONTIN) 300 MG capsule, Take 300 mg by mouth at bedtime. (Patient not taking: Reported on 04/20/2023), Disp: , Rfl:    glipiZIDE (GLUCOTROL XL) 5 MG 24 hr tablet, Take 10 mg by mouth daily., Disp: , Rfl:    Insulin Glargine Solostar (LANTUS) 100 UNIT/ML Solostar Pen, Inject 15 Units into the skin at bedtime., Disp: , Rfl:    lisinopril-hydrochlorothiazide (PRINZIDE,ZESTORETIC) 10-12.5 MG tablet, Take 1 tablet by  mouth daily. (Patient not taking: Reported on 04/20/2023), Disp: , Rfl:    metFORMIN (GLUCOPHAGE-XR) 500 MG 24 hr tablet, Take 1,000 mg by mouth daily with supper. (Patient not taking: Reported on 04/20/2023), Disp: , Rfl:    metoprolol succinate (TOPROL-XL) 25 MG 24 hr tablet, Take 1 tablet by mouth daily., Disp: , Rfl:    Multiple Vitamin (MULTIVITAMIN) tablet, Take 1 tablet by mouth daily., Disp: , Rfl:    nitroGLYCERIN (NITRODUR - DOSED IN MG/24 HR) 0.4 mg/hr patch, Place 0.4 mg onto the skin daily., Disp: , Rfl:    pantoprazole (PROTONIX) 20 MG tablet, Take 20 mg by mouth daily. (Patient not taking: Reported on 04/20/2023), Disp: , Rfl:    progesterone (PROMETRIUM) 200 MG capsule, Take 200 mg by mouth daily., Disp: , Rfl:    ticagrelor (BRILINTA) 90 MG TABS tablet, Take 90 mg by mouth 2 (two) times daily., Disp: , Rfl:   Past Medical History: Past Medical History:  Diagnosis Date   Diabetes mellitus without complication (HCC)    Factor 5 Leiden mutation, heterozygous (HCC)    Hypertension     Tobacco Use: Social History   Tobacco Use  Smoking Status Never  Smokeless Tobacco Never    Labs: Review Flowsheet        No data to display           Exercise Target Goals: Exercise Program Goal: Individual exercise prescription  set using results from initial 6 min walk test and THRR while considering  patient's activity barriers and safety.   Exercise Prescription Goal: Initial exercise prescription builds to 30-45 minutes a day of aerobic activity, 2-3 days per week.  Home exercise guidelines will be given to patient during program as part of exercise prescription that the participant will acknowledge.   Education: Aerobic Exercise: - Group verbal and visual presentation on the components of exercise prescription. Introduces F.I.T.T principle from ACSM for exercise prescriptions.  Reviews F.I.T.T. principles of aerobic exercise including progression. Written material given at  graduation.   Education: Resistance Exercise: - Group verbal and visual presentation on the components of exercise prescription. Introduces F.I.T.T principle from ACSM for exercise prescriptions  Reviews F.I.T.T. principles of resistance exercise including progression. Written material given at graduation.    Education: Exercise & Equipment Safety: - Individual verbal instruction and demonstration of equipment use and safety with use of the equipment. Flowsheet Row Cardiac Rehab from 05/05/2023 in [facility] Cardiac [facility] Pulmonary Rehab  Date 04/21/23  Educator NT  Instruction Review Code 1- Verbalizes Understanding       Education: Exercise Physiology & General Exercise Guidelines: - Group verbal and written instruction with models to review the exercise physiology of the cardiovascular system and associated critical values. Provides general exercise guidelines with specific guidelines to those with heart or lung disease.    Education: Flexibility, Balance, Mind/Body Relaxation: - Group verbal and visual presentation with interactive activity on the components of exercise prescription. Introduces F.I.T.T principle from ACSM for exercise prescriptions. Reviews F.I.T.T. principles of flexibility and balance exercise training including progression. Also discusses the mind body connection.  Reviews various relaxation techniques to help reduce and manage stress (i.e. Deep breathing, progressive muscle relaxation, and visualization). Balance handout provided to take home. Written material given at graduation. Flowsheet Row Cardiac Rehab from 05/05/2023 in [facility] Cardiac and Pulmonary Rehab  Date 05/05/23  Educator NT  Instruction Review Code 1- Verbalizes Understanding       Activity Barriers & Risk Stratification:  Activity Barriers & Cardiac Risk Stratification - 04/20/23 1503       Activity Barriers & Cardiac Risk Stratification   Activity Barriers Back Problems;Other (comment)    Comments  neuropathy    Cardiac Risk Stratification Moderate             6 Minute Walk:  6 Minute Walk     Row Name 04/21/23 1001         6 Minute Walk   Phase Initial     Distance 1240 feet     Walk Time 6 minutes     # of Rest Breaks 0     MPH 2.35     METS 3.43     RPE 9     Perceived Dyspnea  0     VO2 Peak 12.01     Symptoms Yes (comment)     Comments Feet tingling     Resting HR 71 bpm     Resting BP 112/64     Resting Oxygen Saturation  97 %     Exercise Oxygen Saturation  during 6 min walk 99 %     Max Ex. HR 93 bpm     Max Ex. BP 128/66     2 Minute Post BP 116/64              Oxygen Initial Assessment:   Oxygen Re-Evaluation:   Oxygen Discharge (Final Oxygen Re-Evaluation):  Initial Exercise Prescription:  Initial Exercise Prescription - 04/21/23 1000       Date of Initial Exercise RX and Referring Provider   Date 04/21/23    Referring Provider Rozell Searing Custovic, DO      Oxygen   Maintain Oxygen Saturation 88% or higher      Treadmill   MPH 2.3    Grade 1    Minutes 15    METs 3.08      NuStep   Level 3    SPM 80    Minutes 15    METs 3.43      REL-XR   Level 3    Speed 50    Minutes 15    METs 3.43      Prescription Details   Frequency (times per week) 3    Duration Progress to 30 minutes of continuous aerobic without signs/symptoms of physical distress      Intensity   THRR 40-80% of Max Heartrate 108-146    Ratings of Perceived Exertion 11-13    Perceived Dyspnea 0-4      Progression   Progression Continue to progress workloads to maintain intensity without signs/symptoms of physical distress.      Resistance Training   Training Prescription Yes    Weight 4 lb    Reps 10-15             Perform Capillary Blood Glucose checks as needed.  Exercise Prescription Changes:   Exercise Prescription Changes     Row Name 04/21/23 1000 05/10/23 1500 05/26/23 1100         Response to Exercise   Blood Pressure  (Admit) 112/64 126/66 118/64     Blood Pressure (Exercise) 128/66 148/74 158/82     Blood Pressure (Exit) 116/64 118/58 110/60     Heart Rate (Admit) 71 bpm 97 bpm 75 bpm     Heart Rate (Exercise) 93 bpm 128 bpm 121 bpm     Heart Rate (Exit) 76 bpm 82 bpm 83 bpm     Oxygen Saturation (Admit) 97 % -- --     Oxygen Saturation (Exercise) 99 % -- --     Rating of Perceived Exertion (Exercise) 9 14 14      Perceived Dyspnea (Exercise) 0 -- --     Symptoms feet tingling none none     Comments Results First two weeks of exercise --     Duration -- Continue with 30 min of aerobic exercise without signs/symptoms of physical distress. Continue with 30 min of aerobic exercise without signs/symptoms of physical distress.     Intensity -- THRR unchanged THRR unchanged       Progression   Progression -- Continue to progress workloads to maintain intensity without signs/symptoms of physical distress. Continue to progress workloads to maintain intensity without signs/symptoms of physical distress.     Average METs -- 3.17 3.56       Resistance Training   Training Prescription -- Yes Yes     Weight -- 4 lb 4 lb     Reps -- 10-15 10-15       Interval Training   Interval Training -- No No       Treadmill   MPH -- 3 2.9     Grade -- 2.5 2.5     Minutes -- 15 15     METs -- 4.33 4.22       NuStep   Level -- 5  T6 5  T6  Minutes -- 15 15     METs -- 2.7 2.3       REL-XR   Level -- 4 5     Minutes -- 15 15     METs -- 3.8 4       Oxygen   Maintain Oxygen Saturation -- 88% or higher 88% or higher              Exercise Comments:   Exercise Comments     Row Name 04/26/23 0751           Exercise Comments First full day of exercise!  Patient was oriented to gym and equipment including functions, settings, policies, and procedures.  Patient's individual exercise prescription and treatment plan were reviewed.  All starting workloads were established based on the results of the 6  minute walk test done at initial orientation visit.  The plan for exercise progression was also introduced and progression will be customized based on patient's performance and goals.                Exercise Goals and Review:   Exercise Goals     Row Name 04/21/23 1000             Exercise Goals   Increase Physical Activity Yes       Intervention Provide advice, education, support and counseling about physical activity/exercise needs.;Develop an individualized exercise prescription for aerobic and resistive training based on initial evaluation findings, risk stratification, comorbidities and participant's personal goals.       Expected Outcomes Short Term: Attend rehab on a regular basis to increase amount of physical activity.;Long Term: Add in home exercise to make exercise part of routine and to increase amount of physical activity.;Long Term: Exercising regularly at least 3-5 days a week.       Increase Strength and Stamina Yes       Intervention Provide advice, education, support and counseling about physical activity/exercise needs.;Develop an individualized exercise prescription for aerobic and resistive training based on initial evaluation findings, risk stratification, comorbidities and participant's personal goals.       Expected Outcomes Short Term: Increase workloads from initial exercise prescription for resistance, speed, and METs.;Long Term: Improve cardiorespiratory fitness, muscular endurance and strength as measured by increased METs and functional capacity ( );Short Term: Perform resistance training exercises routinely during rehab and add in resistance training at home       Able to understand and use rate of perceived exertion (RPE) scale Yes       Intervention Provide education and explanation on how to use RPE scale       Expected Outcomes Short Term: Able to use RPE daily in rehab to express subjective intensity level;Long Term:  Able to use RPE to guide  intensity level when exercising independently       Able to understand and use Dyspnea scale Yes       Intervention Provide education and explanation on how to use Dyspnea scale       Expected Outcomes Short Term: Able to use Dyspnea scale daily in rehab to express subjective sense of shortness of breath during exertion;Long Term: Able to use Dyspnea scale to guide intensity level when exercising independently       Knowledge and understanding of Target Heart Rate Range (THRR) Yes       Intervention Provide education and explanation of THRR including how the numbers were predicted and where they are located for reference  Expected Outcomes Short Term: Able to state/look up THRR;Long Term: Able to use THRR to govern intensity when exercising independently;Short Term: Able to use daily as guideline for intensity in rehab       Able to check pulse independently Yes       Intervention Review the importance of being able to check your own pulse for safety during independent exercise;Provide education and demonstration on how to check pulse in carotid and radial arteries.       Expected Outcomes Short Term: Able to explain why pulse checking is important during independent exercise;Long Term: Able to check pulse independently and accurately       Understanding of Exercise Prescription Yes       Intervention Provide education, explanation, and written materials on patient's individual exercise prescription       Expected Outcomes Long Term: Able to explain home exercise prescription to exercise independently;Short Term: Able to explain program exercise prescription                Exercise Goals Re-Evaluation :  Exercise Goals Re-Evaluation     Row Name 04/26/23 0751 05/10/23 1544 05/17/23 0759 05/24/23 0805 05/26/23 1142     Exercise Goal Re-Evaluation   Exercise Goals Review Increase Physical Activity;Able to understand and use rate of perceived exertion (RPE) scale;Knowledge and  understanding of Target Heart Rate Range (THRR);Understanding of Exercise Prescription;Increase Strength and Stamina;Able to check pulse independently Increase Physical Activity;Understanding of Exercise Prescription;Increase Strength and Stamina Increase Physical Activity;Increase Strength and Stamina;Understanding of Exercise Prescription Increase Physical Activity;Understanding of Exercise Prescription;Increase Strength and Stamina Increase Physical Activity;Understanding of Exercise Prescription;Increase Strength and Stamina   Comments Reviewed RPE and dyspnea scale, THR and program prescription with pt today.  Pt voiced understanding and was given a copy of goals to take home. Erica Ayala is off to a good start in the program. She completed her first 2 weeks of exercise in this review. She has tolerated her exercise program well. She already increased her workloads. She increased her workload on the treadmill to a speed of 3 mph and incline of 2.5%. She also increased to level 5 on the T6 nustep and level 4 on the XR. We will continue to monitor her progress in the program. Erica Ayala is doing well here at rehab, she reports seeing some improvement in strength and stamina. She is walking at home on nice days. She is doing some stretches too. Encouraged her to include some hand weights or resistance bands. 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 Erica Ayala stated that her husband just bought an elliptical and was interested in using it at home. She has never used on before and she was encouraged to try the EL in cardiac rehab class first to see how she tolerated it and slowing build up her endurance on a more challenging machine before using it at home. Erica Ayala is doing well in rehab. She was able to increase to level 5 on the XR. She was also able to maintain her relative workload on the treadmill, and the T6 nustep. We will continue to monitor her progress in the program.    Expected Outcomes Short: Use RPE daily to regulate intensity.  Long: Follow program prescription in THR. Short: Continue to follow current exercise prescription. Long: Continue exercise to improve strength and stamina. STG: continue to attend cardiac rehab and include more weights and resistance bands during stretchs. LTG: Continue to exercise to  improve strength and stamina Short: try to use the EL in class to build up to 20 min. Long: be able to use EL at home for home exercise. Short: Continue to increase treadmill workload. Long: Continue exercise to improve strength and stamina.            Discharge Exercise Prescription (Final Exercise Prescription Changes):  Exercise Prescription Changes - 05/26/23 1100       Response to Exercise   Blood Pressure (Admit) 118/64    Blood Pressure (Exercise) 158/82    Blood Pressure (Exit) 110/60    Heart Rate (Admit) 75 bpm    Heart Rate (Exercise) 121 bpm    Heart Rate (Exit) 83 bpm    Rating of Perceived Exertion (Exercise) 14    Symptoms none    Duration Continue with 30 min of aerobic exercise without signs/symptoms of physical distress.    Intensity THRR unchanged      Progression   Progression Continue to progress workloads to maintain intensity without signs/symptoms of physical distress.    Average METs 3.56      Resistance Training   Training Prescription Yes    Weight 4 lb    Reps 10-15      Interval Training   Interval Training No      Treadmill   MPH 2.9    Grade 2.5    Minutes 15    METs 4.22      NuStep   Level 5   T6   Minutes 15    METs 2.3      REL-XR   Level 5    Minutes 15    METs 4      Oxygen   Maintain Oxygen Saturation 88% or higher             Nutrition:  Target Goals: Understanding of nutrition guidelines, daily intake of sodium 1500mg , cholesterol 200mg , calories 30% from fat and 7% or less from saturated fats, daily to have 5 or more servings of fruits and vegetables.  Education: All  About Nutrition: -Group instruction provided by verbal, written material, interactive activities, discussions, models, and posters to present general guidelines for heart healthy nutrition including fat, fiber, MyPlate, the role of sodium in heart healthy nutrition, utilization of the nutrition label, and utilization of this knowledge for meal planning. Follow up email sent as well. Written material given at graduation. Flowsheet Row Cardiac Rehab from 05/05/2023 in [facility] Cardiac and Pulmonary Rehab  Education need identified 04/21/23       Biometrics:  Pre Biometrics - 04/21/23 1001       Pre Biometrics   Height 5' 4.75" (1.645 m)    Weight 169 lb 8 oz (76.9 kg)    Waist Circumference 37 inches    Hip Circumference 39 inches    Waist to Hip Ratio 0.95 %    BMI (Calculated) 28.41    Single Leg Stand 5.2 seconds              Nutrition Therapy Plan and Nutrition Goals:  Nutrition Therapy & Goals - 04/21/23 1104       Nutrition Therapy   Diet Carb controlled    Protein (specify units) 90    Fiber 25 grams    Whole Grain Foods 3 servings    Saturated Fats 15 max. grams    Fruits and Vegetables 5 servings/day    Sodium 2 grams      Personal Nutrition Goals   Nutrition Goal Eat  15-30gProtein and 30-60gCarbs at each meal.    Personal Goal #2 Read labels and reduce sodium intake to below 2300mg . Ideally 1500mg  per day.    Personal Goal #3 Reduce saturated fat, less than 12g per day. Replace bad fats for more heart healthy fats.    Comments Patient is not drinking sugary beverages. She and her mother have DM, so she is carb conscious. Tries to pick complex carbs and keep them controlled. She was not aware of how many carbs to eat at each meal. Educated on carb goal of 30-60g per meal, with snacks roughly half as much. Her A1C was 11% 2 weeks ago, which was alarming. Spoke with her, she reports her life became stressful with several family members in and out of the hospital. She also  reports her Dr placed her on a different medication for her DM. Encouraged her to communicate with Dr about her blood sugars, and to continue to make good carb choices. Educated on [employer], types of fats, sources, and how to read labels. Went over sodium and its effects on blood pressure and heart health. Build out several small meals and snacks that are easier to make for when life gets suddenly busy.      Intervention Plan   Intervention Prescribe, educate and counsel regarding individualized specific dietary modifications aiming towards targeted core components such as weight, hypertension, lipid management, diabetes, heart failure and other comorbidities.;Nutrition handout(s) given to patient.    Expected Outcomes Short Term Goal: Understand basic principles of dietary content, such as calories, fat, sodium, cholesterol and nutrients.;Short Term Goal: A plan has been developed with personal nutrition goals set during dietitian appointment.;Long Term Goal: Adherence to prescribed nutrition plan.             Nutrition Assessments:  MEDIFICTS Score Key: >=70 Need to make dietary changes  40-70 Heart Healthy Diet <= 40 Therapeutic Level Cholesterol Diet  Flowsheet Row Cardiac Rehab from 04/21/2023 in [facility] Cardiac and Pulmonary Rehab  Picture Your Plate Total Score on Admission 68      Picture Your Plate Scores: <16 Unhealthy dietary pattern with much room for improvement. 41-50 Dietary pattern unlikely to meet recommendations for good health and room for improvement. 51-60 More healthful dietary pattern, with some room for improvement.  >60 Healthy dietary pattern, although there may be some specific behaviors that could be improved.    Nutrition Goals Re-Evaluation:  Nutrition Goals Re-Evaluation     Row Name 05/17/23 0810             Goals   Comment Erica Ayala reports she is doing better with controlling her BS. not every day is a good day, but says she has more good  days than bad as of late. Encouraged her to continue check her BS, eat contorlled portions of carbs and comminucate with Dr about her meds and dosages.       Expected Outcome STG: Check BS and communicate with doctor. LTG: Manage BS independently and bring A1C down to below 7%                Nutrition Goals Discharge (Final Nutrition Goals Re-Evaluation):  Nutrition Goals Re-Evaluation - 05/17/23 0810       Goals   Comment Erica Ayala reports she is doing better with controlling her BS. not every day is a good day, but says she has more good days than bad as of late. Encouraged her to continue check her BS, eat contorlled portions of carbs and  comminucate with Dr about her meds and dosages.    Expected Outcome STG: Check BS and communicate with doctor. LTG: Manage BS independently and bring A1C down to below 7%             Psychosocial: Target Goals: Acknowledge presence or absence of significant depression and/or stress, maximize coping skills, provide positive support system. Participant is able to verbalize types and ability to use techniques and skills needed for reducing stress and depression.   Education: Stress, Anxiety, and Depression - Group verbal and visual presentation to define topics covered.  Reviews how body is impacted by stress, anxiety, and depression.  Also discusses healthy ways to reduce stress and to treat/manage anxiety and depression.  Written material given at graduation.   Education: Sleep Hygiene -Provides group verbal and written instruction about how sleep can affect your health.  Define sleep hygiene, discuss sleep cycles and impact of sleep habits. Review good sleep hygiene tips.    Initial Review & Psychosocial Screening:  Initial Psych Review & Screening - 04/20/23 1511       Initial Review   Current issues with Current Sleep Concerns;Current Stress Concerns    Source of Stress Concerns Family      Family Dynamics   Good Support System? Yes    husband, family     Barriers   Psychosocial barriers to participate in program There are no identifiable barriers or psychosocial needs.;The patient should benefit from training in stress management and relaxation.      Screening Interventions   Interventions Encouraged to exercise;Provide feedback about the scores to participant;To provide support and resources with identified psychosocial needs    Expected Outcomes Short Term goal: Utilizing psychosocial counselor, staff and physician to assist with identification of specific Stressors or current issues interfering with healing process. Setting desired goal for each stressor or current issue identified.;Long Term Goal: Stressors or current issues are controlled or eliminated.;Short Term goal: Identification and review with participant of any Quality of Life or Depression concerns found by scoring the questionnaire.;Long Term goal: The participant improves quality of Life and PHQ9 Scores as seen by post scores and/or verbalization of changes             Quality of Life Scores:   Quality of Life - 04/21/23 0958       Quality of Life   Select Quality of Life      Quality of Life Scores   Health/Function Pre 16.4 %    Socioeconomic Pre 23.63 %    Psych/Spiritual Pre 28.29 %    Family Pre 25.2 %    GLOBAL Pre 21.69 %            Scores of 19 and below usually indicate a poorer quality of life in these areas.  A difference of  2-3 points is a clinically meaningful difference.  A difference of 2-3 points in the total score of the Quality of Life Index has been associated with significant improvement in overall quality of life, self-image, physical symptoms, and general health in studies assessing change in quality of life.  PHQ-9: Review Flowsheet       05/17/2023 04/21/2023 02/27/2016  Depression screen PHQ 2/9  Decreased Interest 0 1 0  Down, Depressed, Hopeless 1 1 1   PHQ - 2 Score 1 2 1   Altered sleeping 1 2 -  Tired,  decreased energy 1 3 -  Change in appetite 0 0 -  Feeling bad or failure about yourself  0 0 -  Trouble concentrating 0 1 -  Moving slowly or fidgety/restless 0 0 -  Suicidal thoughts 0 0 -  PHQ-9 Score 3 8 -  Difficult doing work/chores Not difficult at all Somewhat difficult -   Interpretation of Total Score  Total Score Depression Severity:  1-4 = Minimal depression, 5-9 = Mild depression, 10-14 = Moderate depression, 15-19 = Moderately severe depression, 20-27 = Severe depression   Psychosocial Evaluation and Intervention:  Psychosocial Evaluation - 04/20/23 1521       Psychosocial Evaluation & Interventions   Interventions Encouraged to exercise with the program and follow exercise prescription;Stress management education;Relaxation education    Comments Mahati is 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 She had her MI while visiting her hospitalized father. She is helping care for her dad and her mother. Her husband and her also help care for her mother in law who had a stroke soon after Erica Ayala's father-in-law passed away in 2024/01/04. In addition to her parents and in law stress, she also mentions she helps with her husband who is a 100% disabled veteran who struggles with PTSD. 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 Today they also had to put down their family dog. She admits that this all has been a tremendous amount of stress for her and she knows she has to take care of herself. Her duloxetine is helping and she is trying lifestyle changes as well.  She is looking forward to the classes and developing a consistent exercise routine. She is very thankful for her support system and ability to recover.    Expected Outcomes Short: attend cardiac rehab for education and exercise. Long: develop and maintain positive self care habits    Continue Psychosocial Services  Follow up required by staff              Psychosocial Re-Evaluation:  Psychosocial Re-Evaluation     Row Name 05/17/23 0804             Psychosocial Re-Evaluation   Current issues with Current Sleep Concerns       Comments Erica Ayala reports she is doing well with mental health. She does experience stress from fathers health, school, and social life. But she has good coping mechanisms with hobbies like piano, cross stitching and walking. She reports sleep difficulites on some nights, spoke with her Dr and was prescribed melatonin, just started it, will continue to monitor.       Expected Outcomes STG: try melotonin and communicate with Dr about effectiveness. LTG: Maintain positive outlook on health and daily life       Interventions Encouraged to attend Cardiac Rehabilitation for the exercise       Continue Psychosocial Services  Follow up required by staff                Psychosocial Discharge (Final Psychosocial Re-Evaluation):  Psychosocial Re-Evaluation - 05/17/23 0804       Psychosocial Re-Evaluation   Current issues with Current Sleep Concerns    Comments Erica Ayala reports she is doing well with mental health. She does experience stress from fathers health, school, and social life. But she has good coping mechanisms with hobbies like piano, cross stitching and walking. She reports sleep difficulites on some nights, spoke with her Dr and was prescribed melatonin, just started it, will continue to monitor.    Expected Outcomes STG: try melotonin and  communicate with Dr about effectiveness. LTG: Maintain positive outlook on health and daily life    Interventions Encouraged to attend Cardiac Rehabilitation for the exercise    Continue Psychosocial Services  Follow up required by staff             Vocational Rehabilitation: Provide vocational rehab assistance to qualifying candidates.   Vocational Rehab Evaluation & Intervention:  Vocational Rehab - 04/20/23 1510       Initial Vocational Rehab Evaluation &  Intervention   Assessment shows need for Vocational Rehabilitation No             Education: Education Goals: Education classes will be provided on a variety of topics geared toward better understanding of heart health and risk factor modification. Participant will state understanding/return demonstration of topics presented as noted by education test scores.  Learning Barriers/Preferences:  Learning Barriers/Preferences - 04/20/23 1510       Learning Barriers/Preferences   Learning Barriers None    Learning Preferences None             General Cardiac Education Topics:  AED/CPR: - Group verbal and written instruction with the use of models to demonstrate the basic use of the AED with the basic ABC's of resuscitation.   Anatomy and Cardiac Procedures: - Group verbal and visual presentation and models provide information about basic cardiac anatomy and function. Reviews the testing methods done to diagnose heart disease and the outcomes of the test results. Describes the treatment choices: Medical Management, Angioplasty, or Coronary Bypass Surgery for treating various heart conditions including Myocardial Infarction, Angina, Valve Disease, and Cardiac Arrhythmias.  Written material given at graduation. Flowsheet Row Cardiac Rehab from 05/05/2023 in [facility] Cardiac and Pulmonary Rehab  Education need identified 04/21/23       Medication Safety: - Group verbal and visual instruction to review commonly prescribed medications for heart and lung disease. Reviews the medication, class of the drug, and side effects. Includes the steps to properly store meds and maintain the prescription regimen.  Written material given at graduation.   Intimacy: - Group verbal instruction through game format to discuss how heart and lung disease can affect sexual intimacy. Written material given at graduation..   Know Your Numbers and Heart Failure: - Group verbal and visual instruction to  discuss disease risk factors for cardiac and pulmonary disease and treatment options.  Reviews associated critical values for Overweight/Obesity, Hypertension, Cholesterol, and Diabetes.  Discusses basics of heart failure: signs/symptoms and treatments.  Introduces Heart Failure Zone chart for action plan for heart failure.  Written material given at graduation.   Infection Prevention: - Provides verbal and written material to individual with discussion of infection control including proper hand washing and proper equipment cleaning during exercise session. Flowsheet Row Cardiac Rehab from 05/05/2023 in [facility] Cardiac and Pulmonary Rehab  Date 04/21/23  Educator NT  Instruction Review Code 1- Verbalizes Understanding       Falls Prevention: - Provides verbal and written material to individual with discussion of falls prevention and safety. Flowsheet Row Cardiac Rehab from 05/05/2023 in [facility] Cardiac and Pulmonary Rehab  Date 04/21/23  Educator NT  Instruction Review Code 1- Verbalizes Understanding       Other: -Provides group and verbal instruction on various topics (see comments)   Knowledge Questionnaire Score:  Knowledge Questionnaire Score - 04/21/23 0958       Knowledge Questionnaire Score   Pre Score 24/26  Core Components/Risk Factors/Patient Goals at Admission:  Personal Goals and Risk Factors at Admission - 04/20/23 1504       Core Components/Risk Factors/Patient Goals on Admission    Weight Management Yes;Weight Loss    Intervention Weight Management: Develop a combined nutrition and exercise program designed to reach desired caloric intake, while maintaining appropriate intake of nutrient and fiber, sodium and fats, and appropriate energy expenditure required for the weight goal.;Weight Management: Provide education and appropriate resources to help participant work on and attain dietary goals.;Weight Management/Obesity: Establish reasonable short term  and long term weight goals.    Admit Weight 163 lb (73.9 kg)    Expected Outcomes Long Term: Adherence to nutrition and physical activity/exercise program aimed toward attainment of established weight goal;Short Term: Continue to assess and modify interventions until short term weight is achieved;Weight Loss: Understanding of general recommendations for a balanced deficit meal plan, which promotes 1-2 lb weight loss per week and includes a negative energy balance of [phone] kcal/d;Understanding recommendations for meals to include 15-35% energy as protein, 25-35% energy from fat, 35-60% energy from carbohydrates, less than 200mg  of dietary cholesterol, 20-35 gm of total fiber daily;Understanding of distribution of calorie intake throughout the day with the consumption of 4-5 meals/snacks    Diabetes Yes    Intervention Provide education about signs/symptoms and action to take for hypo/hyperglycemia.;Provide education about proper nutrition, including hydration, and aerobic/resistive exercise prescription along with prescribed medications to achieve blood glucose in normal ranges: Fasting glucose 65-99 mg/dL    Expected Outcomes Short Term: Participant verbalizes understanding of the signs/symptoms and immediate care of hyper/hypoglycemia, proper foot care and importance of medication, aerobic/resistive exercise and nutrition plan for blood glucose control.;Long Term: Attainment of HbA1C < 7%.    Hypertension Yes    Intervention Provide education on lifestyle modifcations including regular physical activity/exercise, weight management, moderate sodium restriction and increased consumption of fresh fruit, vegetables, and low fat dairy, alcohol moderation, and smoking cessation.;Monitor prescription use compliance.    Expected Outcomes Short Term: Continued assessment and intervention until BP is < 140/38mm HG in hypertensive participants. < 130/27mm HG in hypertensive participants with diabetes, heart failure  or chronic kidney disease.;Long Term: Maintenance of blood pressure at goal levels.             Education:Diabetes - Individual verbal and written instruction to review signs/symptoms of diabetes, desired ranges of glucose level fasting, after meals and with exercise. Acknowledge that pre and post exercise glucose checks will be done for 3 sessions at entry of program. Flowsheet Row Cardiac Rehab from 05/05/2023 in [facility] Cardiac and Pulmonary Rehab  Date 04/20/23  Educator [facility]  Instruction Review Code 1- Verbalizes Understanding       Core Components/Risk Factors/Patient Goals Review:   Goals and Risk Factor Review     Row Name 05/17/23 0817             Core Components/Risk Factors/Patient Goals Review   Personal Goals Review Weight Management/Obesity;Diabetes       Review Erica Ayala reports she has gain 7lbs since starting the program, she says she has been eating better and exercising more. She has been reducing her carb intake most days, says her BS has been lower too.  Reminded her to focus on long term success and not to be discouraged by a poor start in her weight loss journey. Also discussed and educated about fluid retention sodium and water weight as some times she reports large shifts in weight on short  time frames.       Expected Outcomes STG: lose 5% CBW. LTG: achieve and maintain a healthy weight.                Core Components/Risk Factors/Patient Goals at Discharge (Final Review):   Goals and Risk Factor Review - 05/17/23 0817       Core Components/Risk Factors/Patient Goals Review   Personal Goals Review Weight Management/Obesity;Diabetes    Review Erica Ayala reports she has gain 7lbs since starting the program, she says she has been eating better and exercising more. She has been reducing her carb intake most days, says her BS has been lower too.  Reminded her to focus on long term success and not to be discouraged by a poor start in her weight loss journey. Also  discussed and educated about fluid retention sodium and water weight as some times she reports large shifts in weight on short time frames.    Expected Outcomes STG: lose 5% CBW. LTG: achieve and maintain a healthy weight.             ITP Comments:  ITP Comments     Row Name 04/20/23 1529 04/21/23 0956 04/26/23 0750 05/05/23 0854 06/02/23 0738   ITP Comments Initial phone call completed. Diagnosis can be found in [facility] 2/21. EP Orientation scheduled for Wednesday 2/26 at 8am. Completed and gym orientation. Initial ITP created and sent for review to Dr. Bethann Punches, Medical Director. First full day of exercise!  Patient was oriented to gym and equipment including functions, settings, policies, and procedures.  Patient's individual exercise prescription and treatment plan were reviewed.  All starting workloads were established based on the results of the 6 minute walk test done at initial orientation visit.  The plan for exercise progression was also introduced and progression will be customized based on patient's performance and goals. 30 Day review completed. Medical Director ITP review done, changes made as directed, and signed approval by Medical Director. new to program 30 Day review completed. Medical Director ITP review done, changes made as directed, and signed approval by Medical Director.            Comments:

## 2023-06-02 NOTE — Progress Notes (Signed)
 Daily Session Note  Patient Details  Name: Erica Ayala MRN: [id number] Date of Birth: March 15, 1965 Referring Provider:   Flowsheet Row Cardiac Rehab from 04/21/2023 in [facility] Cardiac and Pulmonary Rehab  Referring Provider Clotilde Dieter, DO       Encounter Date: 06/02/2023  Check In:  Session Check In - 06/02/23 0735       Check-In   Supervising physician immediately available to respond to emergencies See telemetry face sheet for immediately available ER MD    Location [facility]-Cardiac & Pulmonary Rehab    Staff Present Rory Percy, MS, Exercise Physiologist;Marya Lowden RN,BSN,MPA;Maxon Conetta BS, Exercise Physiologist;Joseph Reino Kent RCP,RRT,BSRT    Virtual Visit No    Medication changes reported     No    Fall or balance concerns reported    No    Warm-up and Cool-down Performed on first and last piece of equipment    Resistance Training Performed Yes    VAD Patient? No    PAD/SET Patient? No      Pain Assessment   Currently in Pain? No/denies                Social History   Tobacco Use  Smoking Status Never  Smokeless Tobacco Never    Goals Met:  Independence with exercise equipment Exercise tolerated well No report of concerns or symptoms today Strength training completed today  Goals Unmet:  Not Applicable  Comments: Pt able to follow exercise prescription today without complaint.  Will continue to monitor for progression.    Dr. Bethann Punches is Medical Director for [facility] Cardiac Rehabilitation.  Dr. Vida Rigger is Medical Director for [facility] Pulmonary Rehabilitation.

## 2023-06-04 ENCOUNTER — Encounter: Admitting: *Deleted

## 2023-06-04 DIAGNOSIS — I213 ST elevation (STEMI) myocardial infarction of unspecified site: Secondary | ICD-10-CM

## 2023-06-04 DIAGNOSIS — Z955 Presence of coronary angioplasty implant and graft: Secondary | ICD-10-CM

## 2023-06-04 NOTE — Progress Notes (Signed)
 Daily Session Note  Patient Details  Name: Erica Ayala MRN: [id number] Date of Birth: 1966/01/14 Referring Provider:   Flowsheet Row Cardiac Rehab from 04/21/2023 in [facility] Cardiac and Pulmonary Rehab  Referring Provider Clotilde Dieter, DO       Encounter Date: 06/04/2023  Check In:  Session Check In - 06/04/23 0930       Check-In   Supervising physician immediately available to respond to emergencies See telemetry face sheet for immediately available ER MD    Location [facility]-Cardiac & Pulmonary Rehab    Staff Present Cora Collum, RN, BSN, CCRP;Joseph Hood RCP,RRT,BSRT;Noah Tickle, [place], Exercise Physiologist    Virtual Visit No    Medication changes reported     No    Fall or balance concerns reported    No    Warm-up and Cool-down Performed on first and last piece of equipment    Resistance Training Performed Yes    VAD Patient? No    PAD/SET Patient? No      Pain Assessment   Currently in Pain? No/denies                Social History   Tobacco Use  Smoking Status Never  Smokeless Tobacco Never    Goals Met:  Independence with exercise equipment Exercise tolerated well No report of concerns or symptoms today  Goals Unmet:  Not Applicable  Comments: Pt able to follow exercise prescription today without complaint.  Will continue to monitor for progression.    Dr. Bethann Punches is Medical Director for [facility] Cardiac Rehabilitation.  Dr. Vida Rigger is Medical Director for [facility] Pulmonary Rehabilitation.

## 2023-06-07 ENCOUNTER — Encounter

## 2023-06-08 NOTE — H&P (Signed)
 Erica Ayala is a 58 y.o. female here for Fractional d+c , hysteroscopy and possible resection of polyps if found for postmenopausal bleeding .here for reeval of PMB s/p embx 6/24 with endometrial hyperplasia . S/p 6 months progesterone tx . Now with additional spotting 2-3x/ month  History of Present Illness Erica Ayala is a 58 year old female with diabetes who presents with vulvar itching and bleeding.   She has been experiencing significant vulvar itching since starting progesterone therapy. The itching is external, and she has been using a fungal powder for relief, which has been somewhat effective. She suspects a yeast infection, as her symptoms align with previous experiences.   She reports intermittent bleeding occurring two to three times a week.after her 6 months of progesterone tx. Some of the bleeding may be due to skin tearing from wiping, as she has not been sexually active for a couple of years. She experiences pain during intercourse, which she attributes to the itching and irritation. She was prescribed estrogen, but reports that the medication does not stay in place when applied, even when used at night with an applicator.   She began progesterone therapy with a dosage of 200 mg for three months followed by 100 mg for another three months. The vulvar itching started after initiating this therapy.   She is diabetic, and her blood sugar levels have been elevated since September of the previous year, coinciding with a period of significant personal stress, including the loss of her father-in-law, her mother-in-law's stroke, her mother's surgery, her father's health decline, and her own heart attack.     Past Medical History:  has a past medical history of CTS (carpal tunnel syndrome), DM2 (diabetes mellitus, type 2) (CMS/HHS-HCC), Factor 5 Leiden mutation, heterozygous (CMS/HHS-HCC) (2005), GERD (gastroesophageal reflux disease), HTN (hypertension), Migraine, and Vertigo.  Past  Surgical History:  has a past surgical history that includes Endoscopic Carpal Tunnel Release; Cesarean section; Cholecystectomy; Tonsillectomy; and BTL. Family History: family history includes Breast cancer in her mother; Diabetes in her father; Factor V Leiden deficiency in her brother and father; High blood pressure (Hypertension) in her maternal grandmother; Thyroid disease in her father. Social History:  reports that she has never smoked. She has never used smokeless tobacco. She reports that she does not currently use alcohol. She reports that she does not currently use drugs. OB/GYN History:  OB History       Gravida  4   Para  3   Term  3   Preterm      AB  1   Living  3        SAB      IAB      Ectopic      Molar      Multiple      Live Births  3             Allergies: is allergic to biaxin [clarithromycin], doxycycline, levaquin [levofloxacin], penicillin, and sulfa (sulfonamide antibiotics). Medications:  Current Medications    Current Outpatient Medications:    atorvastatin (LIPITOR) 80 MG tablet, Take 80 mg by mouth once daily, Disp: , Rfl:    BERBERINE CHLORIDE ORAL, Take 1 capsule by mouth 3 (three) times daily, Disp: , Rfl:    canagliflozin (INVOKANA) 100 mg tablet, Take 100 mg by mouth every morning before breakfast, Disp: , Rfl:    cetirizine (ZYRTEC) 10 MG tablet, Take 10 mg by mouth once daily., Disp: , Rfl:    cholecalciferol (  VITAMIN D3) 1000 unit capsule, Take 1,000 Units by mouth once daily, Disp: , Rfl:    DULoxetine (CYMBALTA) 60 MG DR capsule, Take 1 capsule (60 mg total) by mouth once daily (Patient taking differently: Take 20 mg by mouth once daily), Disp: 90 capsule, Rfl: 3   glipiZIDE (GLUCOTROL XL) 10 MG XL tablet, Take 1 tablet (10 mg total) by mouth once daily, Disp: 90 tablet, Rfl: 3   metoprolol SUCCinate (TOPROL-XL) 25 MG XL tablet, Take 1 tablet by mouth once daily, Disp: , Rfl:    multivitamin tablet, Take by mouth once daily,  Disp: , Rfl:    progesterone (PROMETRIUM) 200 MG capsule, Take 1 capsule (200 mg total) by mouth once daily, Disp: 30 capsule, Rfl: 2   ticagrelor (BRILINTA) 90 mg tablet, Take 90 mg by mouth 2 (two) times daily, Disp: , Rfl:    ubidecarenone/vitamin E mixed (COQ10 SG 100 ORAL), Take by mouth, Disp: , Rfl:    albuterol (VENTOLIN HFA) 90 mcg/actuation inhaler, Inhale 2 inhalations into the lungs every 4 (four) hours as needed for Wheezing (Patient not taking: Reported on 05/17/2022), Disp: 1 Inhaler, Rfl: 1   aspirin 81 MG chewable tablet, Take 81 mg by mouth once daily, Disp: , Rfl:    blood glucose diagnostic (ONETOUCH ULTRA BLUE TEST STRIP) test strip, by XX route 2 (two) times daily Use as instructed., Disp: 200 each, Rfl: 1   estradioL (ESTRACE) 0.01 % (0.1 mg/gram) vaginal cream, Place 2 g vaginally once daily (Patient not taking: Reported on 04/16/2023), Disp: 42.5 g, Rfl: 11   [START ON 05/13/2023] estradioL (VAGIFEM) 10 mcg vaginal tablet, Place 1 tablet (10 mcg total) vaginally twice a week, Disp: 8 tablet, Rfl: 11   [START ON 05/16/2023] fluconazole (DIFLUCAN) 150 MG tablet, 1 po weekly x 4weeks, Disp: 4 tablet, Rfl: 0   fluticasone propionate (FLONASE) 50 mcg/actuation nasal spray, Place 1 spray into both nostrils 2 (two) times daily (Patient not taking: Reported on 04/16/2023), Disp: 16 g, Rfl: 0   insulin GLARGINE (LANTUS SOLOSTAR) pen injector (concentration 100 units/mL), Inject 15 Units subcutaneously at bedtime, Disp: 15 mL, Rfl: 0   lisinopriL-hydrochlorothiazide (ZESTORETIC) 10-12.5 mg tablet, Take 1 tablet by mouth once daily (Patient not taking: Reported on 05/17/2022), Disp: 90 tablet, Rfl: 3   metFORMIN (GLUCOPHAGE) 1000 MG tablet, Take 1 tablet (1,000 mg total) by mouth 2 (two) times daily with meals (Patient not taking: Reported on 05/17/2022), Disp: 180 tablet, Rfl: 3   naproxen (NAPROSYN) 500 MG tablet, Take 1 tablet (500 mg total) by mouth 2 (two) times daily as needed (pain) for  up to 30 doses, Disp: 30 tablet, Rfl: 0   nitroGLYcerin (NITRODUR) 0.4 mg/hr patch, Place 1 patch onto the skin once daily Leave patch(es) on for 12-14 hours, then remove for 10-12 hours prior to applying the next patch. (Patient not taking: Reported on 05/11/2023), Disp: , Rfl:    ONETOUCH DELICA LANCETS 30 gauge Misc, USE AS DIRECTED TWICE DAILY, Disp: 100 each, Rfl: 5   ONETOUCH ULTRA2 kit, USE AS DIRECTED, Disp: 1 each, Rfl: 0   pantoprazole (PROTONIX) 20 MG DR tablet, TAKE 1 TABLET(20 MG) BY MOUTH EVERY DAY (Patient not taking: Reported on 05/17/2022), Disp: 90 tablet, Rfl: 0   pen needle, diabetic 32 gauge x 5/16" needle, Use as directed, Disp: 100 each, Rfl: 0   progesterone (PROMETRIUM) 100 MG capsule, Take 1 capsule (100 mg total) by mouth once daily (Patient not taking: Reported on 04/16/2023), Disp:  30 capsule, Rfl: 11   promethazine-dextromethorphan (PROMETHAZINE-DM) 6.25-15 mg/5 mL syrup, Take 5 mLs by mouth every 6 (six) hours as needed, Disp: 120 mL, Rfl: 0   urea (CEROVEL) 40 % topical cream, Apply topically 2 (two) times daily, Disp: 90 g, Rfl: 1     Review of Systems: General:                      No fatigue or weight loss Eyes:                           No vision changes Ears:                            No hearing difficulty Respiratory:                No cough or shortness of breath Pulmonary:                  No asthma or shortness of breath Cardiovascular:           No chest pain, palpitations, dyspnea on exertion Gastrointestinal:          No abdominal bloating, chronic diarrhea, constipations, masses, pain or hematochezia Genitourinary:             No hematuria, dysuria, abnormal vaginal discharge, pelvic pain, Menometrorrhagia, +vulvar itching .,, + PMB  Lymphatic:                   No swollen lymph nodes Musculoskeletal:No muscle weakness Neurologic:                  No extremity weakness, syncope, seizure disorder Psychiatric:                  No history of depression,  delusions or suicidal/homicidal ideation      Exam:       Vitals:    06/09/23  BP: 111/72  Pulse: 75      Body mass index is 30.47 kg/m.   WDWN white/  female in NAD   Lungs: CTA  CV : RRR without murmur     Neck:  no thyromegaly Abdomen: soft , no mass, normal active bowel sounds,  non-tender, no rebound tenderness Pelvic: tanner stage 5 ,  External genitalia: vulva /labia no lesions, + erythema  Urethra: no prolapse Vagina: normal physiologic d/c wet mount : neg . Atrophic lining  Cervix: no lesions, no cervical motion tenderness   Uterus: normal size shape and contour, non-tender Adnexa: no mass,  non-tender   Rectovaginal:  Pelvic exam done  Chaperone present   Impression:    The primary encounter diagnosis was PMB (postmenopausal bleeding). Diagnoses of Dyspareunia, female, Acute vulvitis, Atrophic vaginitis, and Leukorrhea were also pertinent to this visit.       Plan:    Assessment & Plan Endometrial hyperplasia without atypia Progesterone therapy complete with intermittent bleeding.  Recommend Dilation and curettage with hysteroscopy planned if bleeding persists to rule out polyps and other pathology. - Perform dilation and curettage with hysteroscopy to evaluate for polyps and obtain a thorough sampling of the endometrium.         Benefits and risks to surgery: The proposed benefit of the surgery has been discussed with the patient. The possible risks include, but are not limited to: organ injury to the bowel , bladder, ureters, and major  blood vessels and nerves. There is a possibility of additional surgeries resulting from these injuries. There is also the risk of blood transfusion and the need to receive blood products during or after the procedure which may rarely lead to HIV or Hepatitis C infection. There is a risk of developing a deep venous thrombosis or a pulmonary embolism . There is the possibility of wound infection and also anesthetic  complications, even the rare possibility of death. The patient understand the risks and consents signed

## 2023-06-09 ENCOUNTER — Encounter: Admitting: *Deleted

## 2023-06-09 DIAGNOSIS — I213 ST elevation (STEMI) myocardial infarction of unspecified site: Secondary | ICD-10-CM

## 2023-06-09 DIAGNOSIS — Z955 Presence of coronary angioplasty implant and graft: Secondary | ICD-10-CM

## 2023-06-09 NOTE — Progress Notes (Signed)
 Daily Session Note  Patient Details  Name: Erica Ayala MRN: [id number] Date of Birth: Jan 01, 1966 Referring Provider:   Flowsheet Row Cardiac Rehab from 04/21/2023 in [facility] Cardiac [facility] Pulmonary Rehab  Referring Provider Isabell Manzanilla, DO       Encounter Date: 06/09/2023  Check In:  Session Check In - 06/09/23 0750       Check-In   Supervising physician immediately available to respond to emergencies See telemetry face sheet for immediately available ER MD    Location [facility]-Cardiac & Pulmonary Rehab    Staff Present Maud Sorenson, RN, BSN, CCRP;Margaret Best, MS, Exercise Physiologist;Maxon Conetta BS, Exercise Physiologist;Jason Martina Sledge RDN,LDN    Virtual Visit No    Medication changes reported     No    Fall or balance concerns reported    No    Warm-up and Cool-down Performed on first and last piece of equipment    Resistance Training Performed Yes    VAD Patient? No    PAD/SET Patient? No      Pain Assessment   Currently in Pain? No/denies                Social History   Tobacco Use  Smoking Status Never  Smokeless Tobacco Never    Goals Met:  Independence with exercise equipment Exercise tolerated well No report of concerns or symptoms today  Goals Unmet:  Not Applicable  Comments: Pt able to follow exercise prescription today without complaint.  Will continue to monitor for progression.    Dr. Firman Hughes is Medical Director for [facility] Cardiac Rehabilitation.  Dr. Fuad Aleskerov is Medical Director for [facility] Pulmonary Rehabilitation.

## 2023-06-10 ENCOUNTER — Encounter: Payer: Self-pay | Admitting: Obstetrics and Gynecology

## 2023-06-10 ENCOUNTER — Other Ambulatory Visit: Payer: Self-pay

## 2023-06-10 ENCOUNTER — Encounter
Admission: RE | Admit: 2023-06-10 | Discharge: 2023-06-10 | Disposition: A | Discharge status: Home / Self Care | Source: Ambulatory Visit | Attending: Obstetrics and Gynecology | Admitting: Obstetrics and Gynecology

## 2023-06-10 DIAGNOSIS — E1165 Type 2 diabetes mellitus with hyperglycemia: Secondary | ICD-10-CM

## 2023-06-10 DIAGNOSIS — Z01812 Encounter for preprocedural laboratory examination: Secondary | ICD-10-CM

## 2023-06-10 NOTE — Patient Instructions (Addendum)
 Your procedure is scheduled on:                06/18/2023 Friday  Report to the Registration Desk on the 1st floor of the [facility]. To find out your arrival time, please call [phone] between 1PM - 3PM on: Thursday, April 24  If your arrival time is 6:00 am, do not arrive before that time as the [facility] entrance doors do not open until 6:00 am.  REMEMBER: Instructions that are not followed completely may result in serious medical risk, up to and including death; or upon the discretion of your surgeon and anesthesiologist your surgery may need to be rescheduled.  Do not eat food after midnight the night before surgery.  No gum chewing or hard candies.  You may however, drink CLEAR liquids up to 2 hours before you are scheduled to arrive for your surgery. Do not drink anything within 2 hours of your scheduled arrival time.     --water    In addition, your doctor has ordered for you to drink the provided:   Gatorade G2 Drinking this carbohydrate drink up to two hours before surgery helps to reduce insulin resistance and improve patient outcomes. Please complete drinking 2 hours before scheduled arrival time.  One week prior to surgery: Stop Anti-inflammatories (NSAIDS) such as Advil, Aleve, Ibuprofen, Motrin, Naproxen, Naprosyn and Aspirin based products such as Excedrin, Goody's Powder, BC Powder. Stop ANY OVER THE COUNTER supplements until after surgery like : b complex vitamins,BERBERINE,             Cholecalciferol COMPLEX PO),Coenzyme Q10,Misc Natural Products ,Multiple Vitamin,Probiotic   Product,  You may however, continue to take Tylenol if needed for pain up until the day of surgery.  DO not take Invokana 3 days prior to surgery. Last dose is April 21    Ask your PCP /Cardiologist or the prescribing MD of the aspirin and brilinta  for the recommendation for              surgery purposes. Follow the instructions given to you!  Continue taking all of your other  prescription medications up until the day of surgery.  ON THE DAY OF SURGERY ONLY TAKE THESE MEDICATIONS WITH SIPS OF WATER:  atorvastatin (LIPITOR) DULoxetine (CYMBALTA) metoprolol succinate (TOPROL-XL   Use inhalers on the day of surgery and bring to the hospital.    No Alcohol for 24 hours before or after surgery.  No Smoking including e-cigarettes for 24 hours before surgery.  No chewable tobacco products for at least 6 hours before surgery.  No nicotine patches on the day of surgery.  Do not use any "recreational" drugs for at least a week (preferably 2 weeks) before your surgery.  Please be advised that the combination of cocaine and anesthesia may have negative outcomes, up to and including death. If you test positive for cocaine, your surgery will be cancelled.  On the morning of surgery brush your teeth with toothpaste and water, you may rinse your mouth with mouthwash if you wish. Do not swallow any toothpaste or mouthwash.  Use CHG Soap or wipes as directed on instruction sheet.  Do not wear jewelry, make-up, hairpins, clips or nail polish.  For welded (permanent) jewelry: bracelets, anklets, waist bands, etc.  Please have this removed prior to surgery.  If it is not removed, there is a chance that hospital personnel will need to cut it off on the day of surgery.  Do not wear lotions,  powders, or perfumes.   Do not shave body hair from the neck down 48 hours before surgery.  Contact lenses, hearing aids and dentures may not be worn into surgery.  Do not bring valuables to the hospital. [facility] is not responsible for any missing/lost belongings or valuables.   Notify your doctor if there is any change in your medical condition (cold, fever, infection).  Wear comfortable clothing (specific to your surgery type) to the hospital.  After surgery, you can help prevent lung complications by doing breathing exercises.  Take deep breaths and cough every 1-2 hours.  Your doctor may order a device called an Incentive Spirometer to help you take deep breaths.  If you are being discharged the day of surgery, you will not be allowed to drive home. You will need a responsible individual to drive you home and stay with you for 24 hours after surgery.    Please call the Pre-admissions Testing Dept. at [phone] if you have any questions about these instructions.  Surgery Visitation Policy:  Patients having surgery or a procedure may have two visitors.  Children under the age of 65 must have an adult with them who is not the patient.

## 2023-06-11 ENCOUNTER — Encounter

## 2023-06-14 ENCOUNTER — Encounter: Payer: Self-pay | Admitting: Urgent Care

## 2023-06-14 ENCOUNTER — Encounter

## 2023-06-14 ENCOUNTER — Encounter
Admission: RE | Admit: 2023-06-14 | Discharge: 2023-06-14 | Disposition: A | Discharge status: Home / Self Care | Source: Ambulatory Visit | Attending: Obstetrics and Gynecology | Admitting: Obstetrics and Gynecology

## 2023-06-14 DIAGNOSIS — E119 Type 2 diabetes mellitus without complications: Secondary | ICD-10-CM | POA: Diagnosis not present

## 2023-06-14 DIAGNOSIS — Z7984 Long term (current) use of oral hypoglycemic drugs: Secondary | ICD-10-CM | POA: Insufficient documentation

## 2023-06-14 DIAGNOSIS — Z01818 Encounter for other preprocedural examination: Secondary | ICD-10-CM

## 2023-06-14 DIAGNOSIS — Z01812 Encounter for preprocedural laboratory examination: Secondary | ICD-10-CM | POA: Diagnosis present

## 2023-06-14 LAB — TYPE AND SCREEN
ABO/RH(D): O POS
Antibody Screen: NEGATIVE

## 2023-06-14 LAB — CBC
HCT: 40 % (ref 36.0–46.0)
Hemoglobin: 14.3 g/dL (ref 12.0–15.0)
MCH: 30.2 pg (ref 26.0–34.0)
MCHC: 35.8 g/dL (ref 30.0–36.0)
MCV: 84.4 fL (ref 80.0–100.0)
Platelets: 253 10*3/uL (ref 150–400)
RBC: 4.74 MIL/uL (ref 3.87–5.11)
RDW: 12.4 % (ref 11.5–15.5)
WBC: 8.4 10*3/uL (ref 4.0–10.5)
nRBC: 0 % (ref 0.0–0.2)

## 2023-06-14 LAB — BASIC METABOLIC PANEL WITH GFR
Anion gap: 9 (ref 5–15)
BUN: 24 mg/dL — ABNORMAL HIGH (ref 6–20)
CO2: 24 mmol/L (ref 22–32)
Calcium: 9.5 mg/dL (ref 8.9–10.3)
Chloride: 101 mmol/L (ref 98–111)
Creatinine, Ser: 0.78 mg/dL (ref 0.44–1.00)
GFR, Estimated: >60 mL/min (ref >60)
Glucose, Bld: 329 mg/dL — ABNORMAL HIGH (ref 70–99)
Potassium: 4 mmol/L (ref 3.5–5.1)
Sodium: 134 mmol/L — ABNORMAL LOW (ref 135–145)

## 2023-06-14 NOTE — Progress Notes (Signed)
  Perioperative Services: Pre-Admission/Anesthesia Testing  Abnormal Lab Notification    Date: 06/14/23  Name: Erica Ayala MRN:   [id number]  Re: Abnormal labs noted during PAT appointment   Provider(s) Notified: Schermerhorn, Joselyn Nicely, * Notification mode: Routed and/or faxed via [facility]   ABNORMAL LAB VALUE(S): Lab Results  Component Value Date   GLUCOSE 329 (H) 06/14/2023    Notes: Patient with a T2DM diagnosis. She is currently oral canagloflozin and glipizide therapies. Last Hgb A1c was 11.1% on 04/02/2023. In efforts to reduce the risk of developing SSI, or other potential perioperative complications, this communication is being sent in order to determine if patient is deemed to be adequately optimized  for surgery.   In light of the aforementioned A1c, her diabetes could pose increased risks for the patient during their perioperative course and subsequent recovery period. With that being said, the benefit of improving glycemic control must be weighed against the overall risks associated with delaying a necessary elective surgical procedure for this patient.   [name], MSN, APRN, FNP-C, CEN [facility]  Perioperative Services Nurse Practitioner Phone: [phone] Fax: [phone] 06/14/23 4:17 PM

## 2023-06-16 ENCOUNTER — Encounter: Admitting: *Deleted

## 2023-06-16 ENCOUNTER — Encounter: Payer: Self-pay | Admitting: Obstetrics and Gynecology

## 2023-06-16 DIAGNOSIS — Z955 Presence of coronary angioplasty implant and graft: Secondary | ICD-10-CM

## 2023-06-16 DIAGNOSIS — I213 ST elevation (STEMI) myocardial infarction of unspecified site: Secondary | ICD-10-CM

## 2023-06-16 NOTE — Progress Notes (Signed)
 Daily Session Note  Patient Details  Name: LACI FRENKEL MRN: [id number] Date of Birth: 1965-12-18 Referring Provider:   Flowsheet Row Cardiac Rehab from 04/21/2023 in [facility] Cardiac and Pulmonary Rehab  Referring Provider Isabell Manzanilla, DO       Encounter Date: 06/16/2023  Check In:  Session Check In - 06/16/23 0753       Check-In   Supervising physician immediately available to respond to emergencies See telemetry face sheet for immediately available ER MD    Location [facility]-Cardiac & Pulmonary Rehab    Staff Present Lyell Samuel, MS, Exercise Physiologist;Maxon Conetta BS, Exercise Physiologist;Flint Hakeem, RN, BSN, CCRP;Joseph Hood RCP,RRT,BSRT    Virtual Visit No    Medication changes reported     No    Fall or balance concerns reported    No    Warm-up and Cool-down Performed on first and last piece of equipment    Resistance Training Performed Yes    VAD Patient? No    PAD/SET Patient? No      Pain Assessment   Currently in Pain? No/denies                Social History   Tobacco Use  Smoking Status Never  Smokeless Tobacco Never    Goals Met:  Independence with exercise equipment Exercise tolerated well No report of concerns or symptoms today  Goals Unmet:  Not Applicable  Comments: Pt able to follow exercise prescription today without complaint.  Will continue to monitor for progression.    Dr. Firman Hughes is Medical Director for [facility] Cardiac Rehabilitation.  Dr. Fuad Aleskerov is Medical Director for [facility] Pulmonary Rehabilitation.

## 2023-06-16 NOTE — Progress Notes (Signed)
 Perioperative / Anesthesia Services  Pre-Admission Testing Clinical Review / Pre-Operative Anesthesia Consult  Date: 06/16/23  Patient Demographics:  Name: Erica Ayala DOB: [birth year] MRN:   [id number]  Planned Surgical Procedure(s):    Case: [id number] Date/Time: 06/18/23 0715   Procedure: DILATATION AND CURETTAGE /HYSTEROSCOPY - FRACTIONAL D&C/HYSTEROSCOPY, POSSIBLE MYOSURE   Anesthesia type: Choice   Diagnosis: Postmenopausal bleeding [N95.0]   Pre-op diagnosis: postmenopausal bleeding   Location: [facility] OR ROOM 05 / [facility]   Surgeons: Schermerhorn, Joselyn Nicely, MD      NOTE: Available PAT nursing documentation and vital signs have been reviewed. Clinical nursing staff has updated patient's PMH/PSHx, current medication list, and drug allergies/intolerances to ensure comprehensive history available to assist in medical decision making as it pertains to the aforementioned surgical procedure and anticipated anesthetic course. Extensive review of available clinical information personally performed. [place] PMH and PSHx updated with any diagnoses/procedures that  may have been inadvertently omitted during his intake with the pre-admission testing department's nursing staff.  Clinical Discussion:  Erica Ayala is a 58 y.o. female who is submitted for pre-surgical anesthesia review and clearance prior to her undergoing the above procedure. Patient has never been a smoker in the past. Pertinent PMH includes: CAD, inferior STEMI, HTN, HLD, T2DM, GERD (no daily Tx), heterozygous factor V Leiden mutation, postmenopausal bleeding, cervical spondylosis.  Patient is followed by cardiology Braxton Calico, MD). She was last seen in the cardiology clinic on 04/16/2023, at which time she was establishing care with new provider; notes reviewed.  Patient was being seen following recent MI. At the time of her clinic visit, patient doing well overall from a cardiovascular perspective.  Patient denied any chest pain, shortness of breath, PND, orthopnea, palpitations, significant peripheral edema, weakness, fatigue, vertiginous symptoms, or presyncope/syncope. Patient with a past medical history significant for cardiovascular diagnoses. Documented physical exam was grossly benign, providing no evidence of acute exacerbation and/or decompensation of the patient's known cardiovascular conditions.  Patient suffered an inferior wall STEMI on 04/02/2023. She was [facility].    Patient underwent diagnostic LEFT heart catheterization revealing 100% stenosis of the mid to distal RCA.  She underwent coronary thrombectomy and PCI placing a 3.0 x 33 mm Xience DES x 1 to the mid to distal RCA.  Procedure yielded excellent angiographic result and TIMI-3 flow.  Transesophageal echocardiogram was also performed on 04/02/2023 revealing a mildly reduced left ventricular systolic function with an EF of 45-50%.  There was mild global hypokinesis. Left ventricular diastolic Doppler parameters consistent with abnormal relaxation (G1DD).  Right ventricular size and function normal.  There was trivial mitral and pulmonic valve regurgitation.  Insufficient TR jet to calculate RVSP.  Aorta noted to be normal in size with no evidence of ectasia or aneurysmal dilatation.  Following recent inferior STEMI and stent placement, patient is on daily DAPT therapy using ASA + ticagrelor.  Patient reported be compliant with therapy with no evidence reports of GI/GU related bleeding.  Blood pressure well-controlled at 124/82 mmHg on currently prescribed beta-blocker (metoprolol succinate monotherapy, patient is on atorvastatin for her HLD diagnosis and further ASCVD prevention.  Patient has a supply of short acting nitrates (NTG) to use on a as needed basis for recurrent angina/anginal equivalent symptoms; denied recent use. T2DM grossly uncontrolled on currently  prescribed regimen.  Most recent hemoglobin A1c was 11.1% when checked on 04/02/2023. In the setting of known cardiovascular diagnoses and concurrent T2DM,  patient is taking an SGLT2i (canagliflozin) for added cardiovascular and renovascular protection.  Patient does not have an OSAH diagnosis.  Patient recovering following recent MI.  She is making efforts to remain active.  Cardiac rehabilitation program has been recommended.  Patient able to complete all ADLs/IADLs without cardiovascular limitation.  Per the DASI, patient felt to be able to achieve at least 4 METS of physical activity without experiencing any significant degree of angina/anginal equivalent symptoms. No changes were made to her medication regimen during her visit with cardiology.  Patient scheduled to follow-up with outpatient cardiology in 3 months or sooner if needed.  Erica Ayala is scheduled for an elective DILATATION AND CURETTAGE /HYSTEROSCOPY on 06/18/2023 with Dr. Marvell Slider, MD..  Given patient's past medical history significant for cardiovascular diagnoses, presurgical cardiac clearance was sought by the PAT team.  Per cardiology, "recent STEMI with stent placement.  ECG today nonischemic. Patient felt to be appropriate to proceed with OB/GYN procedure/surgery, however she must maintain her DAPT therapy interrupted x 1 year".  Again, this patient is on daily DAPT therapy using ASA + ticagrelor.  Cardiology has approved patient to proceed with planned surgical intervention with OB/GYN, however given recent cardiovascular event, patient must maintain her DAPT therapy without interruption for at least 1 year.  This recommendation has been discussed with OB/GYN provider by cardiology.  Surgeon is amenable to proceeding with patient continuing the prescribed that medications.  Patient has been updated on directives regarding both her ASA and ticagrelor by the PAT staff.  Patient denies previous perioperative complications  with anesthesia in the past. In review her EMR, there are no records available for review pertaining to any anesthetic courses within the [facility] in the recent past.      04/21/2023   10:01 AM 12/07/2022   12:08 PM 12/07/2022    9:46 AM  Vitals with BMI  Height 5' 4.75"  5\' 3"   Weight 169 lbs 8 oz  189 lbs 2 oz  BMI 28.41  33.52  Systolic  168   Diastolic  88   Pulse  89    Providers/Specialists:  NOTE: Primary physician provider listed below. Patient may have been seen by APP or partner within same practice.   PROVIDER ROLE / SPECIALTY LAST Mardella Shadow, MD OB/GYN (Surgeon) 06/09/2023  Nestor Banter, MD Primary Care Provider 04/23/2021  Isabell Manzanilla, DO Cardiology 04/16/2023   Allergies:   Allergies  Allergen Reactions   Biaxin [Clarithromycin] Hives   Doxycycline Nausea Only and Nausea And Vomiting   Levofloxacin Diarrhea   Other     Pt has Factor 5 Leiden a genetic blood clotting disorder where a mutation in the Factor V gene leads to an increased risk of blood clots   Penicillins Hives   Sulfa Antibiotics Hives   Current Home Medications:   No current facility-administered medications for this encounter.    albuterol (PROVENTIL HFA;VENTOLIN HFA) 108 (90 Base) MCG/ACT inhaler   aspirin EC 81 MG tablet   atorvastatin (LIPITOR) 80 MG tablet   b complex vitamins capsule   Barberry-Oreg Grape-Goldenseal (BERBERINE COMPLEX PO)   canagliflozin (INVOKANA) 100 MG TABS tablet   cetirizine (ZYRTEC) 10 MG tablet   Cholecalciferol (D 1000) 25 MCG (1000 UT) capsule   Coenzyme Q10 (COQ10) 100 MG CAPS   DULoxetine (CYMBALTA) 60 MG capsule   Estradiol 10 MCG TABS vaginal tablet   fluconazole (DIFLUCAN) 150 MG tablet   glipiZIDE (GLUCOTROL XL) 10 MG 24 hr  tablet   metoprolol succinate (TOPROL-XL) 25 MG 24 hr tablet   Misc Natural Products (BLOOD SUGAR BALANCE PO)   Misc Natural Products (COLON CLEANSE PO)   Multiple Vitamin (MULTIVITAMIN) tablet    nitroGLYCERIN (NITROSTAT) 0.4 MG SL tablet   Probiotic Product (PROBIOTIC DAILY PO)   progesterone (PROMETRIUM) 100 MG capsule   ticagrelor (BRILINTA) 90 MG TABS tablet   urea (CARMOL) 40 % CREA   History:   Past Medical History:  Diagnosis Date   Acute ST elevation myocardial infarction (STEMI) of inferior wall (HCC) 04/02/2023   a.) LHC/PCI 04/02/2023: 100% m-dRCA  --> coronary thrombectomy + 3.0 x 33 mm Xience DES   CAD (coronary artery disease)    Carpal tunnel syndrome    Cervical spondylosis    Factor 5 Leiden mutation, heterozygous (HCC)    GERD (gastroesophageal reflux disease)    Hypertension    Long term current use of aspirin    Long term current use of ticagrelor therapy    Migraines    Postmenopausal bleeding    T2DM (type 2 diabetes mellitus) (HCC)    Vertigo    Past Surgical History:  Procedure Laterality Date   ABDOMINAL SURGERY     CARPAL TUNNEL RELEASE Right    CESAREAN SECTION     CHOLECYSTECTOMY     CORONARY ANGIOPLASTY WITH STENT PLACEMENT Left 04/02/2023   Procedure: CORONARY ANGIOPLASTY WITH STENT PLACEMENT; Location: [facility]; Surgeon: Myrick Ask, MD   Family History  Problem Relation Age of Onset   Diabetes Father    Social History   Tobacco Use   Smoking status: Never   Smokeless tobacco: Never  Substance Use Topics   Alcohol use: No   Pertinent Clinical Results:  LABS:  Hospital Outpatient Visit on 06/14/2023  Component Date Value Ref Range Status   WBC 06/14/2023 8.4  4.0 - 10.5 K/uL Final   RBC 06/14/2023 4.74  3.87 - 5.11 MIL/uL Final   Hemoglobin 06/14/2023 14.3  12.0 - 15.0 g/dL Final   HCT 91/47/8295 40.0  36.0 - 46.0 % Final   MCV 06/14/2023 84.4  80.0 - 100.0 fL Final   MCH 06/14/2023 30.2  26.0 - 34.0 pg Final   MCHC 06/14/2023 35.8  30.0 - 36.0 g/dL Final   RDW 62/13/0865 12.4  11.5 - 15.5 % Final   Platelets 06/14/2023 253  150 - 400 K/uL Final   nRBC 06/14/2023 0.0   0.0 - 0.2 % Final   Performed at [facility], [address] [place], [place] [address]   ABO/RH(D) 06/14/2023 O POS   Final   Antibody Screen 06/14/2023 NEG   Final   Sample Expiration 06/14/2023 06/28/2023,2359   Final   Extend sample reason 06/14/2023    Final                   Value:NO TRANSFUSIONS OR PREGNANCY IN THE PAST 3 MONTHS Performed at [facility], [address] [place], [place] [address]    Sodium 06/14/2023 134 (L)  135 - 145 mmol/L Final   Potassium 06/14/2023 4.0  3.5 - 5.1 mmol/L Final   Chloride 06/14/2023 101  98 - 111 mmol/L Final   CO2 06/14/2023 24  22 - 32 mmol/L Final   Glucose, Bld 06/14/2023 329 (H)  70 - 99 mg/dL Final   Glucose reference range applies only to samples taken after fasting for at least 8 hours.   BUN 06/14/2023  24 (H)  6 - 20 mg/dL Final   Creatinine, Ser 06/14/2023 0.78  0.44 - 1.00 mg/dL Final   Calcium 54/10/8117 9.5  8.9 - 10.3 mg/dL Final   GFR, Estimated 06/14/2023 >60  >60 mL/min Final   Comment: (NOTE) Calculated using the CKD-EPI Creatinine Equation (2021)    Anion gap 06/14/2023 9  5 - 15 Final   Performed at [facility], [address] [place], [place] [address]    ECG: Date: 04/16/2023  Time ECG obtained: 1105 AM Rate: 72 bpm Rhythm: normal sinus Axis (leads I and aVF): left Intervals: PR 170 ms. QRS 90 ms. QTc 486 ms. ST segment and T wave changes: No evidence of acute T wave abnormalities or significant ST segment elevation or depression.  Evidence of a possible, age undetermined, prior infarct:  No Comparison: Similar to previous tracing obtained on 04/03/2023 NOTE: Tracing obtained at [facility]; unable for review. Above based on cardiologist's interpretation.    IMAGING / PROCEDURES: TRANSTHORACIC ECHO (TTE) COMPLETE performed on 04/02/2023 Left ventricular size is normal.  There is normal left ventricular wall thickness.  Left ventricular systolic function is mildly  reduced. LV ejection fraction = 45-50%.  Left ventricular filling pattern is prolonged relaxation.  There is mild global hypokinesis of the left ventricle.  The right ventricle is normal in size and function.  The aortic sinus is normal size.  IVC size was normal.  There is no pericardial effusion.  There is no significant valvular stenosis or regurgitation.  There is no comparison study available.   LEFT HEART CATHETERIZATION AND CORONARY ANGIOGRAPHY performed on 04/02/2023 Aortic pressure: 150/89  mm Hg (mean 117 mm Hg)  LVEDP = 3 mm Hg , gradient 0 mmHg  Successful IVUS guided PCI to 100% occluded middle to distal RCA with Xience 3.0 x 33 mm, proximal postdilated to 3.25 mm, in conjunction to coronary thrombectomy, resulting in 0% residual stenosis and TIMI-3 flow.   CT CERVICAL SPINE WO CONTRAST performed on 12/07/2022 No evidence of an acute cervical spine fracture. Nonspecific straightening of the expected cervical lordosis. Cervical spondylosis as described.  Impression and Plan:  Erica Ayala has been referred for pre-anesthesia review and clearance prior to her undergoing the planned anesthetic and procedural courses. Available labs, pertinent testing, and imaging results were personally reviewed by me in preparation for upcoming operative/procedural course. [facility] medical record has been updated following extensive record review and patient interview with PAT staff.   This patient has been appropriately cleared by cardiology with an overall ACCEPTABLE  risk of patient experiencing significant perioperative cardiovascular complications. Based on clinical review performed today (06/16/23), barring any significant acute changes in the patient's overall condition, it is anticipated that she will be able to proceed with the planned surgical intervention. Any acute changes in clinical condition may necessitate her procedure being postponed and/or cancelled. Patient will meet with  anesthesia team (MD and/or CRNA) on the day of her procedure for preoperative evaluation/assessment. Questions regarding anesthetic course will be fielded at that time.   Pre-surgical instructions were reviewed with the patient during his PAT appointment, and questions were fielded to satisfaction by PAT clinical staff. She has been instructed on which medications that she will need to hold prior to surgery, as well as the ones that have been deemed safe/appropriate to take on the day of her procedure. As part of the general education provided by PAT, patient made aware both verbally and in writing, that she would need  to abstain from the use of any illegal substances during her perioperative course. She was advised that failure to follow the provided instructions could necessitate case cancellation or result in serious perioperative complications up to and including death. Patient encouraged to contact PAT and/or her surgeon's office to discuss any questions or concerns that may arise prior to surgery; verbalized understanding.   [name], MSN, APRN, FNP-C, CEN [facility]  Perioperative Services Nurse Practitioner Phone: [phone] Fax: [phone] 06/16/23 10:53 AM  NOTE: This note has been prepared using Dragon dictation software. Despite my best ability to proofread, there is always the potential that unintentional transcriptional errors may still occur from this process.

## 2023-06-18 ENCOUNTER — Ambulatory Visit: Admitting: Urgent Care

## 2023-06-18 ENCOUNTER — Encounter

## 2023-06-18 ENCOUNTER — Encounter
Admission: RE | Disposition: A | Discharge status: Home / Self Care | Payer: Self-pay | Source: Home / Self Care | Attending: Obstetrics and Gynecology

## 2023-06-18 ENCOUNTER — Encounter: Payer: Self-pay | Admitting: Obstetrics and Gynecology

## 2023-06-18 ENCOUNTER — Ambulatory Visit
Admission: RE | Admit: 2023-06-18 | Discharge: 2023-06-18 | Disposition: A | Discharge status: Home / Self Care | Attending: Obstetrics and Gynecology | Admitting: Obstetrics and Gynecology

## 2023-06-18 ENCOUNTER — Other Ambulatory Visit: Payer: Self-pay

## 2023-06-18 ENCOUNTER — Ambulatory Visit: Payer: Self-pay | Admitting: Urgent Care

## 2023-06-18 DIAGNOSIS — E785 Hyperlipidemia, unspecified: Secondary | ICD-10-CM | POA: Diagnosis not present

## 2023-06-18 DIAGNOSIS — N950 Postmenopausal bleeding: Secondary | ICD-10-CM | POA: Diagnosis present

## 2023-06-18 DIAGNOSIS — Z794 Long term (current) use of insulin: Secondary | ICD-10-CM | POA: Diagnosis not present

## 2023-06-18 DIAGNOSIS — I509 Heart failure, unspecified: Secondary | ICD-10-CM | POA: Insufficient documentation

## 2023-06-18 DIAGNOSIS — Z01812 Encounter for preprocedural laboratory examination: Secondary | ICD-10-CM

## 2023-06-18 DIAGNOSIS — N840 Polyp of corpus uteri: Secondary | ICD-10-CM | POA: Diagnosis not present

## 2023-06-18 DIAGNOSIS — F419 Anxiety disorder, unspecified: Secondary | ICD-10-CM | POA: Diagnosis not present

## 2023-06-18 DIAGNOSIS — K219 Gastro-esophageal reflux disease without esophagitis: Secondary | ICD-10-CM | POA: Insufficient documentation

## 2023-06-18 DIAGNOSIS — Z833 Family history of diabetes mellitus: Secondary | ICD-10-CM | POA: Insufficient documentation

## 2023-06-18 DIAGNOSIS — E1165 Type 2 diabetes mellitus with hyperglycemia: Secondary | ICD-10-CM | POA: Insufficient documentation

## 2023-06-18 DIAGNOSIS — Z79899 Other long term (current) drug therapy: Secondary | ICD-10-CM | POA: Insufficient documentation

## 2023-06-18 DIAGNOSIS — I252 Old myocardial infarction: Secondary | ICD-10-CM | POA: Diagnosis not present

## 2023-06-18 DIAGNOSIS — D6851 Activated protein C resistance: Secondary | ICD-10-CM | POA: Diagnosis not present

## 2023-06-18 DIAGNOSIS — N941 Unspecified dyspareunia: Secondary | ICD-10-CM | POA: Insufficient documentation

## 2023-06-18 DIAGNOSIS — N952 Postmenopausal atrophic vaginitis: Secondary | ICD-10-CM | POA: Insufficient documentation

## 2023-06-18 DIAGNOSIS — Z01818 Encounter for other preprocedural examination: Secondary | ICD-10-CM

## 2023-06-18 DIAGNOSIS — I110 Hypertensive heart disease with heart failure: Secondary | ICD-10-CM | POA: Diagnosis not present

## 2023-06-18 DIAGNOSIS — N762 Acute vulvitis: Secondary | ICD-10-CM | POA: Diagnosis not present

## 2023-06-18 DIAGNOSIS — I25119 Atherosclerotic heart disease of native coronary artery with unspecified angina pectoris: Secondary | ICD-10-CM | POA: Diagnosis not present

## 2023-06-18 DIAGNOSIS — Z87891 Personal history of nicotine dependence: Secondary | ICD-10-CM | POA: Insufficient documentation

## 2023-06-18 DIAGNOSIS — Z8249 Family history of ischemic heart disease and other diseases of the circulatory system: Secondary | ICD-10-CM | POA: Diagnosis not present

## 2023-06-18 DIAGNOSIS — Z955 Presence of coronary angioplasty implant and graft: Secondary | ICD-10-CM | POA: Insufficient documentation

## 2023-06-18 DIAGNOSIS — Z7984 Long term (current) use of oral hypoglycemic drugs: Secondary | ICD-10-CM | POA: Diagnosis not present

## 2023-06-18 HISTORY — PX: HYSTEROSCOPY WITH D & C: SHX1775

## 2023-06-18 HISTORY — DX: Migraine, unspecified, not intractable, without status migrainosus: G43.909

## 2023-06-18 HISTORY — DX: Spondylosis without myelopathy or radiculopathy, cervical region: M47.812

## 2023-06-18 HISTORY — DX: Carpal tunnel syndrome, unspecified upper limb: G56.00

## 2023-06-18 HISTORY — DX: Dizziness and giddiness: R42

## 2023-06-18 HISTORY — DX: Atherosclerotic heart disease of native coronary artery without angina pectoris: I25.10

## 2023-06-18 HISTORY — DX: Postmenopausal bleeding: N95.0

## 2023-06-18 HISTORY — DX: Type 2 diabetes mellitus without complications: E11.9

## 2023-06-18 HISTORY — DX: Long term (current) use of aspirin: Z79.82

## 2023-06-18 HISTORY — DX: Other long term (current) drug therapy: Z79.899

## 2023-06-18 HISTORY — DX: Gastro-esophageal reflux disease without esophagitis: K21.9

## 2023-06-18 LAB — GLUCOSE, CAPILLARY
Glucose-Capillary: 325 mg/dL — ABNORMAL HIGH (ref 70–99)
Glucose-Capillary: 302 mg/dL — ABNORMAL HIGH (ref 70–99)
Glucose-Capillary: 239 mg/dL — ABNORMAL HIGH (ref 70–99)

## 2023-06-18 LAB — ABO/RH: ABO/RH(D): O POS

## 2023-06-18 SURGERY — DILATATION AND CURETTAGE /HYSTEROSCOPY
Anesthesia: General | Site: Uterus

## 2023-06-18 MED ORDER — 0.9 % SODIUM CHLORIDE (POUR BTL) OPTIME
TOPICAL | Status: DC | PRN
  Administered 2023-06-18: 500 mL

## 2023-06-18 MED ORDER — PHENYLEPHRINE 80 MCG/ML (10ML) SYRINGE FOR IV PUSH (FOR BLOOD PRESSURE SUPPORT)
PREFILLED_SYRINGE | INTRAVENOUS | Status: DC | PRN
  Administered 2023-06-18 (×4): 80 ug via INTRAVENOUS

## 2023-06-18 MED ORDER — FENTANYL CITRATE (PF) 100 MCG/2ML IJ SOLN
INTRAMUSCULAR | Status: AC
  Filled 2023-06-18: qty 2

## 2023-06-18 MED ORDER — DEXAMETHASONE SODIUM PHOSPHATE 10 MG/ML IJ SOLN
INTRAMUSCULAR | Status: DC | PRN
  Administered 2023-06-18: 5 mg via INTRAVENOUS

## 2023-06-18 MED ORDER — CEFAZOLIN SODIUM-DEXTROSE 2-4 GM/100ML-% IV SOLN
2.0000 g | Freq: Once | INTRAVENOUS | Status: AC
  Administered 2023-06-18: 2 g via INTRAVENOUS

## 2023-06-18 MED ORDER — DEXAMETHASONE SODIUM PHOSPHATE 10 MG/ML IJ SOLN
INTRAMUSCULAR | Status: AC
  Filled 2023-06-18: qty 1

## 2023-06-18 MED ORDER — LIDOCAINE HCL (PF) 2 % IJ SOLN
INTRAMUSCULAR | Status: AC
  Filled 2023-06-18: qty 5

## 2023-06-18 MED ORDER — SODIUM CHLORIDE 0.9% FLUSH: 3.0000 mL | Freq: Two times a day (BID) | INTRAVENOUS | Status: DC

## 2023-06-18 MED ORDER — SODIUM CHLORIDE 0.9 % IV SOLN: INTRAVENOUS | Status: DC

## 2023-06-18 MED ORDER — INSULIN ASPART 100 UNIT/ML IJ SOLN
10.0000 [IU] | Freq: Once | INTRAMUSCULAR | Status: AC
  Administered 2023-06-18: 10 [IU] via SUBCUTANEOUS

## 2023-06-18 MED ORDER — PROPOFOL 10 MG/ML IV BOLUS
INTRAVENOUS | Status: DC | PRN
  Administered 2023-06-18: 150 mg via INTRAVENOUS

## 2023-06-18 MED ORDER — CHLORHEXIDINE GLUCONATE 0.12 % MT SOLN
OROMUCOSAL | Status: AC
  Filled 2023-06-18: qty 15

## 2023-06-18 MED ORDER — MIDAZOLAM HCL 2 MG/2ML IJ SOLN
INTRAMUSCULAR | Status: DC | PRN
  Administered 2023-06-18: 2 mg via INTRAVENOUS

## 2023-06-18 MED ORDER — ACETAMINOPHEN 500 MG PO TABS
1000.0000 mg | ORAL_TABLET | ORAL | Status: AC
  Administered 2023-06-18: 1000 mg via ORAL

## 2023-06-18 MED ORDER — POVIDONE-IODINE 10 % EX SWAB: 2.0000 | Freq: Once | CUTANEOUS | Status: DC

## 2023-06-18 MED ORDER — EPHEDRINE SULFATE-NACL 50-0.9 MG/10ML-% IV SOSY
PREFILLED_SYRINGE | INTRAVENOUS | Status: DC | PRN
  Administered 2023-06-18 (×5): 5 mg via INTRAVENOUS

## 2023-06-18 MED ORDER — INSULIN ASPART 100 UNIT/ML IJ SOLN
INTRAMUSCULAR | Status: AC
  Filled 2023-06-18: qty 1

## 2023-06-18 MED ORDER — CHLORHEXIDINE GLUCONATE 0.12 % MT SOLN
15.0000 mL | Freq: Once | OROMUCOSAL | Status: AC
  Administered 2023-06-18: 15 mL via OROMUCOSAL

## 2023-06-18 MED ORDER — DEXMEDETOMIDINE HCL IN NACL 80 MCG/20ML IV SOLN
INTRAVENOUS | Status: AC
  Filled 2023-06-18: qty 20

## 2023-06-18 MED ORDER — ORAL CARE MOUTH RINSE: 15.0000 mL | Freq: Once | OROMUCOSAL | Status: AC

## 2023-06-18 MED ORDER — ACETAMINOPHEN 500 MG PO TABS
ORAL_TABLET | ORAL | Status: AC
  Filled 2023-06-18: qty 2

## 2023-06-18 MED ORDER — SODIUM CHLORIDE 0.9% FLUSH: 3.0000 mL | INTRAVENOUS | Status: DC | PRN

## 2023-06-18 MED ORDER — SODIUM CHLORIDE 0.9 % IR SOLN
Status: DC | PRN
  Administered 2023-06-18: 3000 mL

## 2023-06-18 MED ORDER — OXYCODONE HCL 5 MG/5ML PO SOLN: 5.0000 mg | Freq: Once | ORAL | Status: DC | PRN

## 2023-06-18 MED ORDER — CEFAZOLIN SODIUM-DEXTROSE 2-4 GM/100ML-% IV SOLN
INTRAVENOUS | Status: AC
  Filled 2023-06-18: qty 100

## 2023-06-18 MED ORDER — FENTANYL CITRATE (PF) 100 MCG/2ML IJ SOLN
INTRAMUSCULAR | Status: DC | PRN
  Administered 2023-06-18: 25 ug via INTRAVENOUS
  Administered 2023-06-18: 50 ug via INTRAVENOUS

## 2023-06-18 MED ORDER — ONDANSETRON HCL 4 MG/2ML IJ SOLN
INTRAMUSCULAR | Status: AC
  Filled 2023-06-18: qty 2

## 2023-06-18 MED ORDER — FENTANYL CITRATE (PF) 100 MCG/2ML IJ SOLN: 25.0000 ug | INTRAMUSCULAR | Status: DC | PRN

## 2023-06-18 MED ORDER — DEXMEDETOMIDINE HCL IN NACL 80 MCG/20ML IV SOLN
INTRAVENOUS | Status: DC | PRN
  Administered 2023-06-18: 8 ug via INTRAVENOUS
  Administered 2023-06-18: 4 ug via INTRAVENOUS

## 2023-06-18 MED ORDER — ONDANSETRON HCL 4 MG/2ML IJ SOLN
INTRAMUSCULAR | Status: DC | PRN
Start: 2023-06-18 — End: 2023-06-18
  Administered 2023-06-18: 4 mg via INTRAVENOUS

## 2023-06-18 MED ORDER — SILVER NITRATE-POT NITRATE 75-25 % EX MISC
CUTANEOUS | Status: DC | PRN
  Administered 2023-06-18: 2

## 2023-06-18 MED ORDER — PROPOFOL 10 MG/ML IV BOLUS
INTRAVENOUS | Status: AC
  Filled 2023-06-18: qty 20

## 2023-06-18 MED ORDER — MIDAZOLAM HCL 2 MG/2ML IJ SOLN
INTRAMUSCULAR | Status: AC
  Filled 2023-06-18: qty 2

## 2023-06-18 MED ORDER — OXYCODONE HCL 5 MG PO TABS: 5.0000 mg | ORAL_TABLET | Freq: Once | ORAL | Status: DC | PRN

## 2023-06-18 MED ORDER — LIDOCAINE HCL (CARDIAC) PF 100 MG/5ML IV SOSY
PREFILLED_SYRINGE | INTRAVENOUS | Status: DC | PRN
  Administered 2023-06-18: 100 mg via INTRAVENOUS

## 2023-06-18 SURGICAL SUPPLY — 21 items
BAG PRESSURE INF REUSE 1000 (BAG) ×1 IMPLANT
DEVICE MYOSURE LITE (MISCELLANEOUS) IMPLANT
DEVICE MYOSURE REACH (MISCELLANEOUS) IMPLANT
DRSG TELFA 3X8 NADH STRL (GAUZE/BANDAGES/DRESSINGS) ×1 IMPLANT
GLOVE SURG SYN 8.0 (GLOVE) ×3 IMPLANT
GLOVE SURG SYN 8.0 PF PI (GLOVE) ×1 IMPLANT
GOWN STRL REUS W/ TWL LRG LVL3 (GOWN DISPOSABLE) ×1 IMPLANT
GOWN STRL REUS W/ TWL XL LVL3 (GOWN DISPOSABLE) ×1 IMPLANT
IV NS 1000ML BAXH (IV SOLUTION) ×1 IMPLANT
KIT PROCEDURE FLUENT (KITS) IMPLANT
KIT TURNOVER CYSTO (KITS) ×1 IMPLANT
MANIFOLD NEPTUNE II (INSTRUMENTS) ×1 IMPLANT
NS IRRIG 500ML POUR BTL (IV SOLUTION) IMPLANT
PACK DNC HYST (MISCELLANEOUS) IMPLANT
SEAL ROD LENS SCOPE MYOSURE (ABLATOR) ×1 IMPLANT
SET CYSTO W/LG BORE CLAMP LF (SET/KITS/TRAYS/PACK) IMPLANT
SOL .9 NS 3000ML IRR UROMATIC (IV SOLUTION) ×1 IMPLANT
SOLUTION PREP PVP 2OZ (MISCELLANEOUS) ×1 IMPLANT
TOWEL OR 17X26 4PK STRL BLUE (TOWEL DISPOSABLE) ×1 IMPLANT
TRAP FLUID SMOKE EVACUATOR (MISCELLANEOUS) ×1 IMPLANT
TUBING CONNECTING 10 (TUBING) IMPLANT

## 2023-06-18 NOTE — Anesthesia Procedure Notes (Signed)
 Procedure Name: LMA Insertion Date/Time: 06/18/2023 7:45 AM  Performed by: Angelia Kelp, CRNAPre-anesthesia Checklist: Patient identified, Emergency Drugs available, Suction available, Patient being monitored and Timeout performed Patient Re-evaluated:Patient Re-evaluated prior to induction Oxygen Delivery Method: Circle system utilized Preoxygenation: Pre-oxygenation with 100% oxygen Induction Type: IV induction LMA: LMA inserted LMA Size: 4.0 Tube type: Oral Number of attempts: 1 Placement Confirmation: positive ETCO2 and breath sounds checked- equal and bilateral Tube secured with: Tape Dental Injury: Teeth and Oropharynx as per pre-operative assessment

## 2023-06-18 NOTE — Transfer of Care (Signed)
 Immediate Anesthesia Transfer of Care Note  Patient: Erica Ayala  Procedure(s) Performed: DILATATION AND CURETTAGE /HYSTEROSCOPY/ ENDOMETRIAL POLYPECTOMY (Uterus)  Patient Location: PACU  Anesthesia Type:General  Level of Consciousness: sedated  Airway & Oxygen Therapy: Patient Spontanous Breathing and Patient connected to face mask oxygen  Post-op Assessment: Report given to RN and Post -op Vital signs reviewed and stable  Post vital signs: Reviewed and stable  Last Vitals:  Vitals Value Taken Time  BP 95/60 06/18/23 0844  Temp    Pulse 64 06/18/23 0844  Resp    SpO2 100 % 06/18/23 0844  Vitals shown include unfiled device data.  Last Pain:  Vitals:   06/18/23 0627  TempSrc: Temporal  PainSc: 0-No pain         Complications: No notable events documented.

## 2023-06-18 NOTE — Brief Op Note (Signed)
 06/18/2023  8:30 AM  PATIENT:  Erica Ayala  58 y.o. female  PRE-OPERATIVE DIAGNOSIS:  postmenopausal bleeding  POST-OPERATIVE DIAGNOSIS:  postmenopausal bleeding Endometrial polyp  PROCEDURE:   Dilation and curettage , Hysteroscopic resection of endometrial polyp with myosure   SURGEON:  Surgeons and Role:    * Maxim Bedel, Joselyn Nicely, MD - Primary  PHYSICIAN ASSISTANT: CST   ASSISTANTS: none   ANESTHESIA:   general  EBL:  5 mL IOF 600 cc uo 200cc  BLOOD ADMINISTERED:none  DRAINS: none   LOCAL MEDICATIONS USED:  NONE  SPECIMEN:  Source of Specimen:  ecc , endometrial curettings with endometrial polyp   DISPOSITION OF SPECIMEN:  PATHOLOGY  COUNTS:  YES  TOURNIQUET:  * No tourniquets in log *  DICTATION: .Other Dictation: Dictation Number verbal  PLAN OF CARE: Discharge to home after PACU  PATIENT DISPOSITION:  PACU - hemodynamically stable.   Delay start of Pharmacological VTE agent (>24hrs) due to surgical blood loss or risk of bleeding: not applicable

## 2023-06-18 NOTE — Progress Notes (Signed)
 Pt here for D+C and H/S possible myosure . Elevated glucose today required 10 units insulin  .  All question answered . Proceed

## 2023-06-18 NOTE — Op Note (Signed)
 NAMEJERRA, Erica Ayala MEDICAL RECORD NO: [id number] ACCOUNT NO: [id number] DATE OF BIRTH: 1966/01/28 FACILITY: [facility] LOCATION: [facility] PHYSICIAN: Carolynn Citrin, MD  Operative Report   DATE OF PROCEDURE: 06/18/2023  PREOPERATIVE DIAGNOSIS:  Postmenopausal bleeding.  POSTOPERATIVE DIAGNOSES: 1.  Postmenopausal bleeding. 2.  Endometrial polyp.  PROCEDURE: 1.  Dilation and curettage. 2.  Hysteroscopic endometrial polypectomy with MyoSure.  SURGEON:  Carolynn Citrin, MD  ANESTHESIA:  General endotracheal anesthesia.  INDICATIONS:  The patient is a 58 year old gravida 4, para 3 with a history of postmenopausal bleeding.  The patient underwent an office biopsy in 07/2022 that showed endometrial hyperplasia.  The patient was placed on 6 months of progesterone and then  redeveloped postmenopausal bleeding.  DESCRIPTION OF PROCEDURE:  After adequate general endotracheal anesthesia, the patient was placed in the dorsal supine position.  Legs in the candy cane stirrups.  The patient's perineum and vagina were prepped and draped in normal sterile fashion.  The  patient did receive 2 g of IV Ancef  for surgical prophylaxis.  Timeout was performed.  Straight catheterization of the bladder yielded 200 mL of urine.  Speculum was placed in the vagina and the cervix was grasped with a single-tooth tenaculum.  An  endocervical curettage was performed followed by uterine sounding to 7 cm.  Cervix was dilated to #16 Hanks dilator without difficulty.  The 5-mm hysteroscope was advanced into the endometrial cavity and direct visualization of the endometrial cavity was  allowed.  There was a 5-mm endometrial polyp juxtaposed to the right fallopian tube ostia.  The MyoSure lite was then advanced into the endometrial cavity and the polyp was removed in its entirety.  Pictures were taken.  The hysteroscope was removed and  the cervix was dilated to a #20 Hanks dilator and then an endometrial  curettage was performed with scant additional tissue.  All specimens will be sent to pathology for pathologic review.  Good hemostasis was noted.  Silver nitrate  applied to the  tenaculum site and the procedure was terminated.  There were no complications.  INTRAOPERATIVE FLUIDS:  800 mL  URINE OUTPUT: 200 mL  ESTIMATED BLOOD LOSS:  Minimal.  DISPOSITION:  The patient tolerated the procedure well and was taken to the recovery room in good condition.   PUS D: 06/18/2023 9:08:20 am T: 06/18/2023 10:20:00 am  JOB: [id number]/ [id number]

## 2023-06-18 NOTE — Anesthesia Preprocedure Evaluation (Addendum)
 Anesthesia Evaluation  Patient identified by MRN, date of birth, ID band Patient awake    Reviewed: Allergy & Precautions, NPO status , Patient's Chart, lab work & pertinent test results  Airway Mallampati: III  TM Distance: >3 FB Neck ROM: full    Dental  (+) Chipped, Dental Advidsory Given   Pulmonary neg pulmonary ROS   Pulmonary exam normal        Cardiovascular hypertension, + CAD, + Past MI and +CHF  Normal cardiovascular exam     Neuro/Psych  PSYCHIATRIC DISORDERS Anxiety     negative neurological ROS     GI/Hepatic Neg liver ROS,GERD  Medicated and Controlled,,  Endo/Other  diabetes, Poorly Controlled    Renal/GU      Musculoskeletal   Abdominal   Peds  Hematology negative hematology ROS (+)   Anesthesia Other Findings Note per Erica Ayala is a 58 y.o. female who is submitted for pre-surgical anesthesia review and clearance prior to her undergoing the above procedure. Patient has never been a smoker in the past. Pertinent PMH includes: CAD, inferior STEMI, HTN, HLD, T2DM, GERD (no daily Tx), heterozygous factor V Leiden mutation, postmenopausal bleeding, cervical spondylosis.   Patient is followed by cardiology Erica Calico, Erica Ayala). She was last seen in the cardiology clinic on 04/16/2023, at which time she was establishing care with new provider; notes reviewed.  Patient was being seen following recent MI. At the time of her clinic visit, patient doing well overall from a cardiovascular perspective. Patient denied any chest pain, shortness of breath, PND, orthopnea, palpitations, significant peripheral edema, weakness, fatigue, vertiginous symptoms, or presyncope/syncope. Patient with a past medical history significant for cardiovascular diagnoses. Documented physical exam was grossly benign, providing no evidence of acute exacerbation and/or decompensation of the patient's known cardiovascular  conditions.    Patient suffered an inferior wall STEMI on 04/02/2023. She was [facility].      Patient underwent diagnostic LEFT heart catheterization revealing 100% stenosis of the mid to distal RCA.  She underwent coronary thrombectomy and PCI placing a 3.0 x 33 mm Xience DES x 1 to the mid to distal RCA.  Procedure yielded excellent angiographic result and TIMI-3 flow.    Transesophageal echocardiogram was also performed on 04/02/2023 revealing a mildly reduced left ventricular systolic function with an EF of 45-50%.  There was mild global hypokinesis. Left ventricular diastolic Doppler parameters consistent with abnormal relaxation (G1DD).  Right ventricular size and function normal.  There was trivial mitral and pulmonic valve regurgitation.  Insufficient TR jet to calculate RVSP.  Aorta noted to be normal in size with no evidence of ectasia or aneurysmal dilatation.   Following recent inferior STEMI and stent placement, patient is on daily DAPT therapy using ASA + ticagrelor.  Patient reported be compliant with therapy with no evidence reports of GI/GU related bleeding.  Blood pressure well-controlled at 124/82 mmHg on currently prescribed beta-blocker (metoprolol succinate monotherapy, patient is on atorvastatin for her HLD diagnosis and further ASCVD prevention.  Patient has a supply of short acting nitrates (NTG) to use on a as needed basis for recurrent angina/anginal equivalent symptoms; denied recent use. T2DM grossly uncontrolled on currently prescribed regimen.  Most recent hemoglobin A1c was 11.1% when checked on 04/02/2023. In the setting of known cardiovascular diagnoses and concurrent T2DM, patient is taking an SGLT2i (canagliflozin) for added cardiovascular and renovascular protection.  Patient does not have an OSAH diagnosis.  Patient recovering  following recent MI.  She is making efforts to remain active.  Cardiac  rehabilitation program has been recommended.  Patient able to complete all ADLs/IADLs without cardiovascular limitation.  Per the DASI, patient felt to be able to achieve at least 4 METS of physical activity without experiencing any significant degree of angina/anginal equivalent symptoms. No changes were made to her medication regimen during her visit with cardiology.  Patient scheduled to follow-up with outpatient cardiology in 3 months or sooner if needed.   Erica Ayala is scheduled for an elective DILATATION AND CURETTAGE /HYSTEROSCOPY on 06/18/2023 with Dr. Marvell Slider, Erica Ayala..  Given patient's past medical history significant for cardiovascular diagnoses, presurgical cardiac clearance was sought by the PAT team.  Per cardiology, "recent STEMI with stent placement.  ECG today nonischemic. Patient felt to be appropriate to proceed with OB/GYN procedure/surgery, however she must maintain her DAPT therapy interrupted x 1 year".   Again, this patient is on daily DAPT therapy using ASA + ticagrelor.  Cardiology has approved patient to proceed with planned surgical intervention with OB/GYN, however given recent cardiovascular event, patient must maintain her DAPT therapy without interruption for at least 1 year.  This recommendation has been discussed with OB/GYN provider by cardiology.  Surgeon is amenable to proceeding with patient continuing the prescribed that medications.  Patient has been updated on directives regarding both her ASA and ticagrelor by the PAT staff.    Past Medical History: 04/02/2023: Acute ST elevation myocardial infarction (STEMI) of  inferior wall (HCC)     Comment:  a.) LHC/PCI 04/02/2023: 100% m-dRCA  --> coronary               thrombectomy + 3.0 x 33 mm Xience DES No date: CAD (coronary artery disease) No date: Carpal tunnel syndrome No date: Cervical spondylosis No date: Factor 5 Leiden mutation, heterozygous (HCC) No date: GERD (gastroesophageal reflux  disease) No date: Hypertension No date: Long term current use of aspirin No date: Long term current use of ticagrelor therapy No date: Migraines No date: Postmenopausal bleeding No date: T2DM (type 2 diabetes mellitus) (HCC) No date: Vertigo  Past Surgical History: No date: ABDOMINAL SURGERY No date: CARPAL TUNNEL RELEASE; Right No date: CESAREAN SECTION No date: CHOLECYSTECTOMY 04/02/2023: CORONARY ANGIOPLASTY WITH STENT PLACEMENT; Left     Comment:  Procedure: CORONARY ANGIOPLASTY WITH STENT PLACEMENT;               Location: [facility]; Surgeon: Erica Ask, Erica Ayala  BMI    Body Mass Index: 31.35 kg/m      Reproductive/Obstetrics negative OB ROS                             Anesthesia Physical Anesthesia Plan  ASA: 4  Anesthesia Plan: General   Post-op Pain Management:    Induction: Intravenous  PONV Risk Score and Plan: 3 and Ondansetron , Dexamethasone  and Midazolam   Airway Management Planned: LMA  Additional Equipment:   Intra-op Plan:   Post-operative Plan: Extubation in OR  Informed Consent: I have reviewed the patients History and Physical, chart, labs and discussed the procedure including the risks, benefits and alternatives for the proposed anesthesia with the patient or authorized representative who has indicated his/her understanding and acceptance.     Dental Advisory Given  Plan Discussed with: Anesthesiologist, CRNA and  Surgeon  Anesthesia Plan Comments: (Patient consented for risks of anesthesia including but not limited to:  - adverse reactions to medications - damage to eyes, teeth, lips or other oral mucosa - nerve damage due to positioning  - sore throat or hoarseness - Damage to heart, brain, nerves, lungs, other parts of body or loss of life  Patient voiced understanding and assent.)       Anesthesia Quick Evaluation

## 2023-06-18 NOTE — Anesthesia Postprocedure Evaluation (Signed)
 Anesthesia Post Note  Patient: LYNNLEIGH SODEN  Procedure(s) Performed: DILATATION AND CURETTAGE /HYSTEROSCOPY/ ENDOMETRIAL POLYPECTOMY (Uterus)  Patient location during evaluation: PACU Anesthesia Type: General Level of consciousness: awake and alert Pain management: pain level controlled Vital Signs Assessment: post-procedure vital signs reviewed and stable Respiratory status: spontaneous breathing, nonlabored ventilation, respiratory function stable and patient connected to nasal cannula oxygen Cardiovascular status: blood pressure returned to baseline and stable Postop Assessment: no apparent nausea or vomiting Anesthetic complications: no  No notable events documented.   Last Vitals:  Vitals:   06/18/23 0926 06/18/23 0938  BP:  125/77  Pulse: 77 73  Resp:  16  Temp: 37 C (!) 36.1 C  SpO2: 99% 100%    Last Pain:  Vitals:   06/18/23 0938  TempSrc: Temporal  PainSc: 3                  Enrique Harvest

## 2023-06-19 ENCOUNTER — Encounter: Payer: Self-pay | Admitting: Obstetrics and Gynecology

## 2023-06-21 ENCOUNTER — Encounter

## 2023-06-21 LAB — SURGICAL PATHOLOGY

## 2023-06-23 ENCOUNTER — Encounter: Admitting: *Deleted

## 2023-06-23 DIAGNOSIS — Z955 Presence of coronary angioplasty implant and graft: Secondary | ICD-10-CM

## 2023-06-23 DIAGNOSIS — I213 ST elevation (STEMI) myocardial infarction of unspecified site: Secondary | ICD-10-CM

## 2023-06-23 NOTE — Progress Notes (Signed)
 Daily Session Note  Patient Details  Name: Erica Ayala MRN: [id number] Date of Birth: 08/18/1965 Referring Provider:   Flowsheet Row Cardiac Rehab from 04/21/2023 in [facility] Card[facility] Re[facility]  Referring Provider Isabell Manzanilla, DO       Encounter Date: 06/23/2023  Check In:  Session Check In - 06/23/23 0747       Check-In   Supervising physician immediately available to respond to emergencies See telemetry face sheet for immediately available ER MD    Location [facility]-Cardiac & Pulmonary Rehab    Staff Present Maxon Conetta BS, Exercise Physiologist;Noah Tickle, BS, Exercise Physiologist;Joseph Nancey Awkward;Maud Sorenson, RN, BSN, CCRP    Virtual Visit No    Medication changes reported     No    Fall or balance concerns reported    No    Warm-up and Cool-down Performed on first and last piece of equipment    Resistance Training Performed Yes    VAD Patient? No    PAD/SET Patient? No      Pain Assessment   Currently in Pain? No/denies                Social History   Tobacco Use  Smoking Status Never  Smokeless Tobacco Never    Goals Met:  Independence with exercise equipment Exercise tolerated well No report of concerns or symptoms today  Goals Unmet:  Not Applicable  Comments: Pt able to follow exercise prescription today without complaint.  Will continue to monitor for progression.    Dr. Firman Hughes is Medical Director for [facility] Cardiac Rehabilitation.  Dr. Fuad Aleskerov is Medical Director for [facility] Pulmonary Rehabilitation.

## 2023-06-25 ENCOUNTER — Encounter: Attending: Internal Medicine | Admitting: *Deleted

## 2023-06-25 DIAGNOSIS — Z955 Presence of coronary angioplasty implant and graft: Secondary | ICD-10-CM | POA: Diagnosis present

## 2023-06-25 DIAGNOSIS — I213 ST elevation (STEMI) myocardial infarction of unspecified site: Secondary | ICD-10-CM | POA: Diagnosis present

## 2023-06-25 NOTE — Progress Notes (Signed)
 Daily Session Note  Patient Details  Name: Erica Ayala MRN: [id number] Date of Birth: 1965-11-04 Referring Provider:   Flowsheet Row Cardiac Rehab from 04/21/2023 in [facility] Cardiac [facility] Rehab  Referring Provider Isabell Manzanilla, DO       Encounter Date: 06/25/2023  Check In:  Session Check In - 06/25/23 0758       Check-In   Supervising physician immediately available to respond to emergencies See telemetry face sheet for immediately available ER MD    Location [facility]-Cardiac & Pulmonary Rehab    Staff Present Maud Sorenson, RN, BSN, CCRP;Joseph Hood RCP,RRT,BSRT;Noah Tickle, [place], Exercise Physiologist    Virtual Visit No    Medication changes reported     Yes    Comments glipizide doubled    Fall or balance concerns reported    No    Resistance Training Performed Yes    VAD Patient? No    PAD/SET Patient? No      Pain Assessment   Currently in Pain? No/denies                Social History   Tobacco Use  Smoking Status Never  Smokeless Tobacco Never    Goals Met:  Independence with exercise equipment Exercise tolerated well No report of concerns or symptoms today  Goals Unmet:  Not Applicable  Comments: Pt able to follow exercise prescription today without complaint.  Will continue to monitor for progression.    Dr. Firman Hughes is Medical Director for [facility] Cardiac Rehabilitation.  Dr. Fuad Aleskerov is Medical Director for [facility] Pulmonary Rehabilitation.

## 2023-06-28 ENCOUNTER — Encounter

## 2023-06-30 ENCOUNTER — Encounter: Admitting: *Deleted

## 2023-06-30 ENCOUNTER — Encounter: Payer: Self-pay | Admitting: *Deleted

## 2023-06-30 DIAGNOSIS — I213 ST elevation (STEMI) myocardial infarction of unspecified site: Secondary | ICD-10-CM

## 2023-06-30 DIAGNOSIS — Z955 Presence of coronary angioplasty implant and graft: Secondary | ICD-10-CM

## 2023-06-30 NOTE — Progress Notes (Signed)
 Cardiac Individual Treatment Plan  Patient Details  Name: MADYLYNN GRAETZ MRN: [id number] Date of Birth: 07-Sep-1965 Referring Provider:   Flowsheet Row Cardiac Rehab from 04/21/2023 in [facility] Cardiac and Pulmonary Rehab  Referring Provider Isabell Manzanilla, DO       Initial Encounter Date:  Flowsheet Row Cardiac Rehab from 04/21/2023 in [facility] Cardiac and Pulmonary Rehab  Date 04/21/23       Visit Diagnosis: Status post coronary artery stent placement  ST elevation myocardial infarction (STEMI), unspecified artery (HCC)  Patient's Home Medications on Admission:  Current Outpatient Medications:    albuterol (PROVENTIL HFA;VENTOLIN HFA) 108 (90 Base) MCG/ACT inhaler, Inhale 2 puffs into the lungs every 4 (four) hours as needed for wheezing or shortness of breath. (Patient not taking: Reported on 06/18/2023), Disp: , Rfl:    aspirin EC 81 MG tablet, Take 81 mg by mouth daily., Disp: , Rfl:    atorvastatin (LIPITOR) 80 MG tablet, Take 80 mg by mouth daily., Disp: , Rfl:    b complex vitamins capsule, Take 1 capsule by mouth daily., Disp: , Rfl:    Barberry-Oreg Grape-Goldenseal (BERBERINE COMPLEX PO), Take 1 capsule by mouth 3 (three) times daily., Disp: , Rfl:    canagliflozin (INVOKANA) 100 MG TABS tablet, Take 100 mg by mouth daily before breakfast., Disp: , Rfl:    cetirizine (ZYRTEC) 10 MG tablet, Take 10 mg by mouth daily., Disp: , Rfl:    Cholecalciferol (D 1000) 25 MCG (1000 UT) capsule, Take 1,000 Units by mouth daily., Disp: , Rfl:    Coenzyme Q10 (COQ10) 100 MG CAPS, Take 100 mg by mouth daily., Disp: , Rfl:    DULoxetine (CYMBALTA) 60 MG capsule, Take 60 mg by mouth daily., Disp: , Rfl:    Estradiol 10 MCG TABS vaginal tablet, Place 10 mcg vaginally 2 (two) times a week., Disp: , Rfl:    fluconazole (DIFLUCAN) 150 MG tablet, Take 150 mg by mouth once a week., Disp: , Rfl:    glipiZIDE (GLUCOTROL XL) 10 MG 24 hr tablet, Take 10 mg by mouth daily with breakfast., Disp: , Rfl:     metoprolol succinate (TOPROL-XL) 25 MG 24 hr tablet, Take 25 mg by mouth daily., Disp: , Rfl:    Misc Natural Products (BLOOD SUGAR BALANCE PO), Take 1 capsule by mouth 3 (three) times daily. Blood Sugar Harmony, Disp: , Rfl:    Misc Natural Products (COLON CLEANSE PO), Take 2 capsules by mouth at bedtime. Plexus Bio Cleanse 60, Disp: , Rfl:    Multiple Vitamin (MULTIVITAMIN) tablet, Take 1 tablet by mouth daily., Disp: , Rfl:    nitroGLYCERIN (NITROSTAT) 0.4 MG SL tablet, Place 0.4 mg under the tongue every 5 (five) minutes as needed for chest pain., Disp: , Rfl:    Probiotic Product (PROBIOTIC DAILY PO), Take 2 capsules by mouth at bedtime. Plexus Pro Bio 5, Disp: , Rfl:    progesterone (PROMETRIUM) 100 MG capsule, Take 100 mg by mouth daily., Disp: , Rfl:    ticagrelor (BRILINTA) 90 MG TABS tablet, Take 90 mg by mouth 2 (two) times daily., Disp: , Rfl:    urea (CARMOL) 40 % CREA, Apply 1 Application topically 2 (two) times daily., Disp: , Rfl:   Past Medical History: Past Medical History:  Diagnosis Date   Acute ST elevation myocardial infarction (STEMI) of inferior wall (HCC) 04/02/2023   a.) LHC/PCI 04/02/2023: 100% m-dRCA  --> coronary thrombectomy + 3.0 x 33 mm Xience DES   CAD (coronary artery disease)  Carpal tunnel syndrome    Cervical spondylosis    Factor 5 Leiden mutation, heterozygous (HCC)    GERD (gastroesophageal reflux disease)    Hypertension    Long term current use of aspirin    Long term current use of ticagrelor therapy    Migraines    Postmenopausal bleeding    T2DM (type 2 diabetes mellitus) (HCC)    Vertigo     Tobacco Use: Social History   Tobacco Use  Smoking Status Never  Smokeless Tobacco Never    Labs: Review Flowsheet        No data to display           Exercise Target Goals: Exercise Program Goal: Individual exercise prescription set using results from initial 6 min walk test and THRR while considering  patient's activity barriers  and safety.   Exercise Prescription Goal: Initial exercise prescription builds to 30-45 minutes a day of aerobic activity, 2-3 days per week.  Home exercise guidelines will be given to patient during program as part of exercise prescription that the participant will acknowledge.   Education: Aerobic Exercise: - Group verbal and visual presentation on the components of exercise prescription. Introduces F.I.T.T principle from ACSM for exercise prescriptions.  Reviews F.I.T.T. principles of aerobic exercise including progression. Written material given at graduation.   Education: Resistance Exercise: - Group verbal and visual presentation on the components of exercise prescription. Introduces F.I.T.T principle from ACSM for exercise prescriptions  Reviews F.I.T.T. principles of resistance exercise including progression. Written material given at graduation.    Education: Exercise & Equipment Safety: - Individual verbal instruction and demonstration of equipment use and safety with use of the equipment. Flowsheet Row Cardiac Rehab from 05/05/2023 in [facility] Cardiac and Pulmonary Rehab  Date 04/21/23  Educator NT  Instruction Review Code 1- Verbalizes Understanding       Education: Exercise Physiology & General Exercise Guidelines: - Group verbal and written instruction with models to review the exercise physiology of the cardiovascular system and associated critical values. Provides general exercise guidelines with specific guidelines to those with heart or lung disease.    Education: Flexibility, Balance, Mind/Body Relaxation: - Group verbal and visual presentation with interactive activity on the components of exercise prescription. Introduces F.I.T.T principle from ACSM for exercise prescriptions. Reviews F.I.T.T. principles of flexibility and balance exercise training including progression. Also discusses the mind body connection.  Reviews various relaxation techniques to help reduce and  manage stress (i.e. Deep breathing, progressive muscle relaxation, and visualization). Balance handout provided to take home. Written material given at graduation. Flowsheet Row Cardiac Rehab from 05/05/2023 in [facility] Cardiac and Pulmonary Rehab  Date 05/05/23  Educator NT  Instruction Review Code 1- Verbalizes Understanding       Activity Barriers & Risk Stratification:  Activity Barriers & Cardiac Risk Stratification - 04/20/23 1503       Activity Barriers & Cardiac Risk Stratification   Activity Barriers Back Problems;Other (comment)    Comments neuropathy    Cardiac Risk Stratification Moderate             6 Minute Walk:  6 Minute Walk     Row Name 04/21/23 1001         6 Minute Walk   Phase Initial     Distance 1240 feet     Walk Time 6 minutes     # of Rest Breaks 0     MPH 2.35     METS 3.43  RPE 9     Perceived Dyspnea  0     VO2 Peak 12.01     Symptoms Yes (comment)     Comments Feet tingling     Resting HR 71 bpm     Resting BP 112/64     Resting Oxygen Saturation  97 %     Exercise Oxygen Saturation  during 6 min walk 99 %     Max Ex. HR 93 bpm     Max Ex. BP 128/66     2 Minute Post BP 116/64              Oxygen Initial Assessment:   Oxygen Re-Evaluation:   Oxygen Discharge (Final Oxygen Re-Evaluation):   Initial Exercise Prescription:  Initial Exercise Prescription - 04/21/23 1000       Date of Initial Exercise RX and Referring Provider   Date 04/21/23    Referring Provider Lanell Pinta Custovic, DO      Oxygen   Maintain Oxygen Saturation 88% or higher      Treadmill   MPH 2.3    Grade 1    Minutes 15    METs 3.08      NuStep   Level 3    SPM 80    Minutes 15    METs 3.43      REL-XR   Level 3    Speed 50    Minutes 15    METs 3.43      Prescription Details   Frequency (times per week) 3    Duration Progress to 30 minutes of continuous aerobic without signs/symptoms of physical distress      Intensity   THRR  40-80% of Max Heartrate 108-146    Ratings of Perceived Exertion 11-13    Perceived Dyspnea 0-4      Progression   Progression Continue to progress workloads to maintain intensity without signs/symptoms of physical distress.      Resistance Training   Training Prescription Yes    Weight 4 lb    Reps 10-15             Perform Capillary Blood Glucose checks as needed.  Exercise Prescription Changes:   Exercise Prescription Changes     Row Name 04/21/23 1000 05/10/23 1500 05/26/23 1100 06/10/23 0800 06/23/23 0700     Response to Exercise   Blood Pressure (Admit) 112/64 126/66 118/64 128/70 --   Blood Pressure (Exercise) 128/66 148/74 158/82 138/60 --   Blood Pressure (Exit) 116/64 118/58 110/60 102/60 --   Heart Rate (Admit) 71 bpm 97 bpm 75 bpm 75 bpm --   Heart Rate (Exercise) 93 bpm 128 bpm 121 bpm 121 bpm --   Heart Rate (Exit) 76 bpm 82 bpm 83 bpm 84 bpm --   Oxygen Saturation (Admit) 97 % -- -- -- --   Oxygen Saturation (Exercise) 99 % -- -- -- --   Rating of Perceived Exertion (Exercise) 9 14 14 14  --   Perceived Dyspnea (Exercise) 0 -- -- -- --   Symptoms feet tingling none none none --   Comments Results First two weeks of exercise -- -- --   Duration -- Continue with 30 min of aerobic exercise without signs/symptoms of physical distress. Continue with 30 min of aerobic exercise without signs/symptoms of physical distress. Continue with 30 min of aerobic exercise without signs/symptoms of physical distress. --   Intensity -- THRR unchanged THRR unchanged THRR unchanged --     Progression  Progression -- Continue to progress workloads to maintain intensity without signs/symptoms of physical distress. Continue to progress workloads to maintain intensity without signs/symptoms of physical distress. Continue to progress workloads to maintain intensity without signs/symptoms of physical distress. --   Average METs -- 3.17 3.56 3.65 --     Resistance Training    Training Prescription -- Yes Yes Yes --   Weight -- 4 lb 4 lb 5 lb --   Reps -- 10-15 10-15 10-15 --     Interval Training   Interval Training -- No No No --     Treadmill   MPH -- 3 2.9 3 --   Grade -- 2.5 2.5 2.5 --   Minutes -- 15 15 15  --   METs -- 4.33 4.22 4.33 --     NuStep   Level -- 5  T6 5  T6 5 --   Minutes -- 15 15 15  --   METs -- 2.7 2.3 2.6 --     REL-XR   Level -- 4 5 6  --   Minutes -- 15 15 15  --   METs -- 3.8 4 4.2 --     T5 Nustep   Level -- -- -- 7  T6 --   SPM -- -- -- 80 --   Minutes -- -- -- 15 --   METs -- -- -- 2.3 --     Home Exercise Plan   Plans to continue exercise at -- -- -- -- Home (comment)   Frequency -- -- -- -- Add 2 additional days to program exercise sessions.   Initial Home Exercises Provided -- -- -- -- 06/23/23     Oxygen   Maintain Oxygen Saturation -- 88% or higher 88% or higher 88% or higher 88% or higher    Row Name 06/24/23 0900             Response to Exercise   Blood Pressure (Admit) 118/64       Blood Pressure (Exit) 104/62       Heart Rate (Admit) 67 bpm       Heart Rate (Exercise) 140 bpm       Heart Rate (Exit) 77 bpm       Oxygen Saturation (Admit) 95 %       Oxygen Saturation (Exercise) 93 %       Oxygen Saturation (Exit) 98 %       Rating of Perceived Exertion (Exercise) 17       Symptoms none       Duration Continue with 30 min of aerobic exercise without signs/symptoms of physical distress.       Intensity THRR unchanged         Progression   Progression Continue to progress workloads to maintain intensity without signs/symptoms of physical distress.       Average METs 3.44         Resistance Training   Training Prescription Yes       Weight 5 lb       Reps 10-15         Interval Training   Interval Training No         Treadmill   MPH 3.2       Grade 2.5       Minutes 15       METs 4.55         NuStep   Level 6  T6       Minutes 15  METs 2.6         T5 Nustep   Level 6        Minutes 15       METs 2.6         Home Exercise Plan   Plans to continue exercise at Home (comment)       Frequency Add 2 additional days to program exercise sessions.       Initial Home Exercises Provided 06/23/23         Oxygen   Maintain Oxygen Saturation 88% or higher                Exercise Comments:   Exercise Comments     Row Name 04/26/23 0751           Exercise Comments First full day of exercise!  Patient was oriented to gym and equipment including functions, settings, policies, and procedures.  Patient's individual exercise prescription and treatment plan were reviewed.  All starting workloads were established based on the results of the 6 minute walk test done at initial orientation visit.  The plan for exercise progression was also introduced and progression will be customized based on patient's performance and goals.                Exercise Goals and Review:   Exercise Goals     Row Name 04/21/23 1000             Exercise Goals   Increase Physical Activity Yes       Intervention Provide advice, education, support and counseling about physical activity/exercise needs.;Develop an individualized exercise prescription for aerobic and resistive training based on initial evaluation findings, risk stratification, comorbidities and participant's personal goals.       Expected Outcomes Short Term: Attend rehab on a regular basis to increase amount of physical activity.;Long Term: Add in home exercise to make exercise part of routine and to increase amount of physical activity.;Long Term: Exercising regularly at least 3-5 days a week.       Increase Strength and Stamina Yes       Intervention Provide advice, education, support and counseling about physical activity/exercise needs.;Develop an individualized exercise prescription for aerobic and resistive training based on initial evaluation findings, risk stratification, comorbidities and participant's personal  goals.       Expected Outcomes Short Term: Increase workloads from initial exercise prescription for resistance, speed, and METs.;Long Term: Improve cardiorespiratory fitness, muscular endurance and strength as measured by increased METs and functional capacity ( );Short Term: Perform resistance training exercises routinely during rehab and add in resistance training at home       Able to understand and use rate of perceived exertion (RPE) scale Yes       Intervention Provide education and explanation on how to use RPE scale       Expected Outcomes Short Term: Able to use RPE daily in rehab to express subjective intensity level;Long Term:  Able to use RPE to guide intensity level when exercising independently       Able to understand and use Dyspnea scale Yes       Intervention Provide education and explanation on how to use Dyspnea scale       Expected Outcomes Short Term: Able to use Dyspnea scale daily in rehab to express subjective sense of shortness of breath during exertion;Long Term: Able to use Dyspnea scale to guide intensity level when exercising independently       Knowledge and understanding of  Target Heart Rate Range (THRR) Yes       Intervention Provide education and explanation of THRR including how the numbers were predicted and where they are located for reference       Expected Outcomes Short Term: Able to state/look up THRR;Long Term: Able to use THRR to govern intensity when exercising independently;Short Term: Able to use daily as guideline for intensity in rehab       Able to check pulse independently Yes       Intervention Review the importance of being able to check your own pulse for safety during independent exercise;Provide education and demonstration on how to check pulse in carotid and radial arteries.       Expected Outcomes Short Term: Able to explain why pulse checking is important during independent exercise;Long Term: Able to check pulse independently and accurately        Understanding of Exercise Prescription Yes       Intervention Provide education, explanation, and written materials on patient's individual exercise prescription       Expected Outcomes Long Term: Able to explain home exercise prescription to exercise independently;Short Term: Able to explain program exercise prescription                Exercise Goals Re-Evaluation :  Exercise Goals Re-Evaluation     Row Name 04/26/23 0751 05/10/23 1544 05/17/23 0759 05/24/23 0805 05/26/23 1142     Exercise Goal Re-Evaluation   Exercise Goals Review Increase Physical Activity;Able to understand and use rate of perceived exertion (RPE) scale;Knowledge and understanding of Target Heart Rate Range (THRR);Understanding of Exercise Prescription;Increase Strength and Stamina;Able to check pulse independently Increase Physical Activity;Understanding of Exercise Prescription;Increase Strength and Stamina Increase Physical Activity;Increase Strength and Stamina;Understanding of Exercise Prescription Increase Physical Activity;Understanding of Exercise Prescription;Increase Strength and Stamina Increase Physical Activity;Understanding of Exercise Prescription;Increase Strength and Stamina   Comments Reviewed RPE and dyspnea scale, THR and program prescription with pt today.  Pt voiced understanding and was given a copy of goals to take home. Rhian is off to a good start in the program. She completed her first 2 weeks of exercise in this review. She has tolerated her exercise program well. She already increased her workloads. She increased her workload on the treadmill to a speed of 3 mph and incline of 2.5%. She also increased to level 5 on the T6 nustep and level 4 on the XR. We will continue to monitor her progress in the program. Jaxi is doing well here at rehab, she reports seeing some improvement in strength and stamina. She is walking at home on nice days. She is doing some stretches too. Encouraged her to  include some hand weights or resistance bands. 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 Demia stated that her husband just bought an elliptical and was interested in using it at home. She has never used on before and she was encouraged to try the EL in cardiac rehab class first to see how she tolerated it and slowing build up her endurance on a more challenging machine before using it at home. Pranavi is doing well in rehab. She was able to increase to level 5 on the XR. She was also able to maintain her relative workload on the treadmill, and the T6 nustep. We will continue to monitor her progress in the program.   Expected Outcomes Short: Use RPE daily to regulate intensity.  Long: Follow program  prescription in THR. Short: Continue to follow current exercise prescription. Long: Continue exercise to improve strength and stamina. STG: continue to attend cardiac rehab and include more weights and resistance bands during stretchs. LTG: Continue to exercise to improve strength and stamina Short: try to use the EL in class to build up to 20 min. Long: be able to use EL at home for home exercise. Short: Continue to increase treadmill workload. Long: Continue exercise to improve strength and stamina.    Row Name 06/10/23 0981 06/16/23 0808 06/23/23 0751 06/24/23 0937       Exercise Goal Re-Evaluation   Exercise Goals Review Increase Physical Activity;Understanding of Exercise Prescription;Increase Strength and Stamina Increase Physical Activity;Increase Strength and Stamina;Understanding of Exercise Prescription Increase Physical Activity;Increase Strength and Stamina;Understanding of Exercise Prescription;Knowledge and understanding of Target Heart Rate Range (THRR);Able to understand and use Dyspnea scale;Able to understand and use rate of perceived exertion (RPE) scale;Able to check pulse independently Increase Strength and Stamina;Increase Physical  Activity;Understanding of Exercise Prescription    Comments Joyanna is doing well in rehab. She increased to level 6 on the XR and level 7 on the T6 nustep. She was also able to increase her speed on the treadmill to 3 mph with 2.5% incline. She also increased to 5 lb handweights for resistance. We will continue to monitor her progress in the program. Caela is doing well here at rehab. She has been on the T6, treadmill and did some on the elliptical but found it challenging. She has one at home and would like to start working on improving at it. Encouraged her to continue to work with it here at rehab. She is doing hand weights, stretches and walks at home as exercise. Reviewed home exercise with pt today.  Pt plans to walk at the area around her home and use her personal elliptical and stationary bike for exercise.  Reviewed THR, pulse, RPE, sign and symptoms, pulse oximetery and when to call 911 or MD.  Also discussed weather considerations and indoor options.  Pt voiced understanding. Jameah is doing well in rehab. She was able to increase her speed on the treadmill from 3 to 3.2 mph. She was also able to increase from level5 to 6 on the T6 nustep. We will continue to monitor her progress in the program.    Expected Outcomes Short: Continue to progressively increase treadmill, XR , and nustep workloads. Long: Continue exercise to improve strength and stamina. STG: Increase workload on treadmill, XR, and elliptical. LTG Continue exercise to improve strength and stamina. Short: Begin exercising at home on days away from rehab. Long: Continue to exercise independently. Short: Continue to increase treadmill workload. Long: Continue exercise to improve strength and stamina.             Discharge Exercise Prescription (Final Exercise Prescription Changes):  Exercise Prescription Changes - 06/24/23 0900       Response to Exercise   Blood Pressure (Admit) 118/64    Blood Pressure (Exit) 104/62    Heart  Rate (Admit) 67 bpm    Heart Rate (Exercise) 140 bpm    Heart Rate (Exit) 77 bpm    Oxygen Saturation (Admit) 95 %    Oxygen Saturation (Exercise) 93 %    Oxygen Saturation (Exit) 98 %    Rating of Perceived Exertion (Exercise) 17    Symptoms none    Duration Continue with 30 min of aerobic exercise without signs/symptoms of physical distress.    Intensity THRR unchanged  Progression   Progression Continue to progress workloads to maintain intensity without signs/symptoms of physical distress.    Average METs 3.44      Resistance Training   Training Prescription Yes    Weight 5 lb    Reps 10-15      Interval Training   Interval Training No      Treadmill   MPH 3.2    Grade 2.5    Minutes 15    METs 4.55      NuStep   Level 6   T6   Minutes 15    METs 2.6      T5 Nustep   Level 6    Minutes 15    METs 2.6      Home Exercise Plan   Plans to continue exercise at Home (comment)    Frequency Add 2 additional days to program exercise sessions.    Initial Home Exercises Provided 06/23/23      Oxygen   Maintain Oxygen Saturation 88% or higher             Nutrition:  Target Goals: Understanding of nutrition guidelines, daily intake of sodium 1500mg , cholesterol 200mg , calories 30% from fat and 7% or less from saturated fats, daily to have 5 or more servings of fruits and vegetables.  Education: All About Nutrition: -Group instruction provided by verbal, written material, interactive activities, discussions, models, and posters to present general guidelines for heart healthy nutrition including fat, fiber, MyPlate, the role of sodium in heart healthy nutrition, utilization of the nutrition label, and utilization of this knowledge for meal planning. Follow up email sent as well. Written material given at graduation. Flowsheet Row Cardiac Rehab from 05/05/2023 in [facility] Cardiac and Pulmonary Rehab  Education need identified 04/21/23       Biometrics:  Pre  Biometrics - 04/21/23 1001       Pre Biometrics   Height 5' 4.75" (1.645 m)    Weight 169 lb 8 oz (76.9 kg)    Waist Circumference 37 inches    Hip Circumference 39 inches    Waist to Hip Ratio 0.95 %    BMI (Calculated) 28.41    Single Leg Stand 5.2 seconds              Nutrition Therapy Plan and Nutrition Goals:  Nutrition Therapy & Goals - 04/21/23 1104       Nutrition Therapy   Diet Carb controlled    Protein (specify units) 90    Fiber 25 grams    Whole Grain Foods 3 servings    Saturated Fats 15 max. grams    Fruits and Vegetables 5 servings/day    Sodium 2 grams      Personal Nutrition Goals   Nutrition Goal Eat 15-30gProtein and 30-60gCarbs at each meal.    Personal Goal #2 Read labels and reduce sodium intake to below 2300mg . Ideally 1500mg  per day.    Personal Goal #3 Reduce saturated fat, less than 12g per day. Replace bad fats for more heart healthy fats.    Comments Patient is not drinking sugary beverages. She and her mother have DM, so she is carb conscious. Tries to pick complex carbs and keep them controlled. She was not aware of how many carbs to eat at each meal. Educated on carb goal of 30-60g per meal, with snacks roughly half as much. Her A1C was 11% 2 weeks ago, which was alarming. Spoke with her, she reports her life became stressful with  several family members in and out of the hospital. She also reports her Dr placed her on a different medication for her DM. Encouraged her to communicate with Dr about her blood sugars, and to continue to make good carb choices. Educated on [employer], types of fats, sources, and how to read labels. Went over sodium and its effects on blood pressure and heart health. Build out several small meals and snacks that are easier to make for when life gets suddenly busy.      Intervention Plan   Intervention Prescribe, educate and counsel regarding individualized specific dietary modifications aiming towards targeted  core components such as weight, hypertension, lipid management, diabetes, heart failure and other comorbidities.;Nutrition handout(s) given to patient.    Expected Outcomes Short Term Goal: Understand basic principles of dietary content, such as calories, fat, sodium, cholesterol and nutrients.;Short Term Goal: A plan has been developed with personal nutrition goals set during dietitian appointment.;Long Term Goal: Adherence to prescribed nutrition plan.             Nutrition Assessments:  MEDIFICTS Score Key: >=70 Need to make dietary changes  40-70 Heart Healthy Diet <= 40 Therapeutic Level Cholesterol Diet  Flowsheet Row Cardiac Rehab from 04/21/2023 in [facility] Cardiac and Pulmonary Rehab  Picture Your Plate Total Score on Admission 68      Picture Your Plate Scores: <09 Unhealthy dietary pattern with much room for improvement. 41-50 Dietary pattern unlikely to meet recommendations for good health and room for improvement. 51-60 More healthful dietary pattern, with some room for improvement.  >60 Healthy dietary pattern, although there may be some specific behaviors that could be improved.    Nutrition Goals Re-Evaluation:  Nutrition Goals Re-Evaluation     Row Name 05/17/23 0810 06/16/23 0813           Goals   Comment Jessicka reports she is doing better with controlling her BS. not every day is a good day, but says she has more good days than bad as of late. Encouraged her to continue check her BS, eat contorlled portions of carbs and comminucate with Dr about her meds and dosages. Yocelin reports she is packing healthier lunches with more veggies and fruits to take to work. Says he blood sugars have been "okay" but wants to talk with her Dr about medication adjustments.      Expected Outcome STG: Check BS and communicate with doctor. LTG: Manage BS independently and bring A1C down to below 7% STG: Check BS, continue to pack healthier lunches. LTG Manage BS independently and bring  A1C down to below 7%               Nutrition Goals Discharge (Final Nutrition Goals Re-Evaluation):  Nutrition Goals Re-Evaluation - 06/16/23 0813       Goals   Comment Cortasia reports she is packing healthier lunches with more veggies and fruits to take to work. Says he blood sugars have been "okay" but wants to talk with her Dr about medication adjustments.    Expected Outcome STG: Check BS, continue to pack healthier lunches. LTG Manage BS independently and bring A1C down to below 7%             Psychosocial: Target Goals: Acknowledge presence or absence of significant depression and/or stress, maximize coping skills, provide positive support system. Participant is able to verbalize types and ability to use techniques and skills needed for reducing stress and depression.   Education: Stress, Anxiety, and Depression - Group  verbal and visual presentation to define topics covered.  Reviews how body is impacted by stress, anxiety, and depression.  Also discusses healthy ways to reduce stress and to treat/manage anxiety and depression.  Written material given at graduation.   Education: Sleep Hygiene -Provides group verbal and written instruction about how sleep can affect your health.  Define sleep hygiene, discuss sleep cycles and impact of sleep habits. Review good sleep hygiene tips.    Initial Review & Psychosocial Screening:  Initial Psych Review & Screening - 04/20/23 1511       Initial Review   Current issues with Current Sleep Concerns;Current Stress Concerns    Source of Stress Concerns Family      Family Dynamics   Good Support System? Yes   husband, family     Barriers   Psychosocial barriers to participate in program There are no identifiable barriers or psychosocial needs.;The patient should benefit from training in stress management and relaxation.      Screening Interventions   Interventions Encouraged to exercise;Provide feedback about the scores to  participant;To provide support and resources with identified psychosocial needs    Expected Outcomes Short Term goal: Utilizing psychosocial counselor, staff and physician to assist with identification of specific Stressors or current issues interfering with healing process. Setting desired goal for each stressor or current issue identified.;Long Term Goal: Stressors or current issues are controlled or eliminated.;Short Term goal: Identification and review with participant of any Quality of Life or Depression concerns found by scoring the questionnaire.;Long Term goal: The participant improves quality of Life and PHQ9 Scores as seen by post scores and/or verbalization of changes             Quality of Life Scores:   Quality of Life - 04/21/23 0958       Quality of Life   Select Quality of Life      Quality of Life Scores   Health/Function Pre 16.4 %    Socioeconomic Pre 23.63 %    Psych/Spiritual Pre 28.29 %    Family Pre 25.2 %    GLOBAL Pre 21.69 %            Scores of 19 and below usually indicate a poorer quality of life in these areas.  A difference of  2-3 points is a clinically meaningful difference.  A difference of 2-3 points in the total score of the Quality of Life Index has been associated with significant improvement in overall quality of life, self-image, physical symptoms, and general health in studies assessing change in quality of life.  PHQ-9: Review Flowsheet       05/17/2023 04/21/2023 02/27/2016  Depression screen PHQ 2/9  Decreased Interest 0 1 0  Down, Depressed, Hopeless 1 1 1   PHQ - 2 Score 1 2 1   Altered sleeping 1 2 -  Tired, decreased energy 1 3 -  Change in appetite 0 0 -  Feeling bad or failure about yourself  0 0 -  Trouble concentrating 0 1 -  Moving slowly or fidgety/restless 0 0 -  Suicidal thoughts 0 0 -  PHQ-9 Score 3 8 -  Difficult doing work/chores Not difficult at all Somewhat difficult -   Interpretation of Total Score  Total Score  Depression Severity:  1-4 = Minimal depression, 5-9 = Mild depression, 10-14 = Moderate depression, 15-19 = Moderately severe depression, 20-27 = Severe depression   Psychosocial Evaluation and Intervention:  Psychosocial Evaluation - 04/20/23 1521  Psychosocial Evaluation & Interventions   Interventions Encouraged to exercise with the program and follow exercise prescription;Stress management education;Relaxation education    Comments 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 She had her MI while visiting her hospitalized father. She is helping care for her dad and her mother. Her husband and her also help care for her mother in law who had a stroke soon after Kortne's father-in-law passed away in 12-19-2023. In addition to her parents and in law stress, she also mentions she helps with her husband who is a 100% disabled veteran who struggles with PTSD. 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 Today they also had to put down their family dog. She admits that this all has been a tremendous amount of stress for her and she knows she has to take care of herself. Her duloxetine is helping and she is trying lifestyle changes as well.  She is looking forward to the classes and developing a consistent exercise routine. She is very thankful for her support system and ability to recover.    Expected Outcomes Short: attend cardiac rehab for education and exercise. Long: develop and maintain positive self care habits    Continue Psychosocial Services  Follow up required by staff             Psychosocial Re-Evaluation:  Psychosocial Re-Evaluation     Row Name 05/17/23 0804 06/16/23 0811           Psychosocial Re-Evaluation   Current issues with Current Sleep Concerns Current Sleep Concerns      Comments Allinson reports she is doing well with mental health. She does experience stress from fathers health,  school, and social life. But she has good coping mechanisms with hobbies like piano, cross stitching and walking. She reports sleep difficulites on some nights, spoke with her Dr and was prescribed melatonin, just started it, will continue to monitor. Ilo denies any anxeity, depression, or stress. Says she is dealing with caring for parents during their health declines but will have more free time to care for them in a few months. She is not sleeping well, has been working with Dr to find a medication to help      Expected Outcomes STG: try melotonin and communicate with Dr about effectiveness. LTG: Maintain positive outlook on health and daily life STG: Practice good sleep hygiene. LTG: Maintain positive outlook on health and daily life      Interventions Encouraged to attend Cardiac Rehabilitation for the exercise Encouraged to attend Cardiac Rehabilitation for the exercise      Continue Psychosocial Services  Follow up required by staff Follow up required by staff               Psychosocial Discharge (Final Psychosocial Re-Evaluation):  Psychosocial Re-Evaluation - 06/16/23 0811       Psychosocial Re-Evaluation   Current issues with Current Sleep Concerns    Comments Eastyn denies any anxeity, depression, or stress. Says she is dealing with caring for parents during their health declines but will have more free time to care for them in a few months. She is not sleeping well, has been working with Dr to find a medication to help    Expected Outcomes STG: Practice good sleep hygiene. LTG: Maintain positive outlook on health and daily life    Interventions Encouraged to attend Cardiac Rehabilitation for the exercise  Continue Psychosocial Services  Follow up required by staff             Vocational Rehabilitation: Provide vocational rehab assistance to qualifying candidates.   Vocational Rehab Evaluation & Intervention:  Vocational Rehab - 04/20/23 1510       Initial  Vocational Rehab Evaluation & Intervention   Assessment shows need for Vocational Rehabilitation No             Education: Education Goals: Education classes will be provided on a variety of topics geared toward better understanding of heart health and risk factor modification. Participant will state understanding/return demonstration of topics presented as noted by education test scores.  Learning Barriers/Preferences:  Learning Barriers/Preferences - 04/20/23 1510       Learning Barriers/Preferences   Learning Barriers None    Learning Preferences None             General Cardiac Education Topics:  AED/CPR: - Group verbal and written instruction with the use of models to demonstrate the basic use of the AED with the basic ABC's of resuscitation.   Anatomy and Cardiac Procedures: - Group verbal and visual presentation and models provide information about basic cardiac anatomy and function. Reviews the testing methods done to diagnose heart disease and the outcomes of the test results. Describes the treatment choices: Medical Management, Angioplasty, or Coronary Bypass Surgery for treating various heart conditions including Myocardial Infarction, Angina, Valve Disease, and Cardiac Arrhythmias.  Written material given at graduation. Flowsheet Row Cardiac Rehab from 05/05/2023 in [facility] Cardiac and Pulmonary Rehab  Education need identified 04/21/23       Medication Safety: - Group verbal and visual instruction to review commonly prescribed medications for heart and lung disease. Reviews the medication, class of the drug, and side effects. Includes the steps to properly store meds and maintain the prescription regimen.  Written material given at graduation.   Intimacy: - Group verbal instruction through game format to discuss how heart and lung disease can affect sexual intimacy. Written material given at graduation..   Know Your Numbers and Heart Failure: - Group verbal  and visual instruction to discuss disease risk factors for cardiac and pulmonary disease and treatment options.  Reviews associated critical values for Overweight/Obesity, Hypertension, Cholesterol, and Diabetes.  Discusses basics of heart failure: signs/symptoms and treatments.  Introduces Heart Failure Zone chart for action plan for heart failure.  Written material given at graduation.   Infection Prevention: - Provides verbal and written material to individual with discussion of infection control including proper hand washing and proper equipment cleaning during exercise session. Flowsheet Row Cardiac Rehab from 05/05/2023 in [facility] Cardiac and Pulmonary Rehab  Date 04/21/23  Educator NT  Instruction Review Code 1- Verbalizes Understanding       Falls Prevention: - Provides verbal and written material to individual with discussion of falls prevention and safety. Flowsheet Row Cardiac Rehab from 05/05/2023 in [facility] Cardiac and Pulmonary Rehab  Date 04/21/23  Educator NT  Instruction Review Code 1- Verbalizes Understanding       Other: -Provides group and verbal instruction on various topics (see comments)   Knowledge Questionnaire Score:  Knowledge Questionnaire Score - 04/21/23 0958       Knowledge Questionnaire Score   Pre Score 24/26             Core Components/Risk Factors/Patient Goals at Admission:  Personal Goals and Risk Factors at Admission - 04/20/23 1504       Core Components/Risk Factors/Patient Goals on  Admission    Weight Management Yes;Weight Loss    Intervention Weight Management: Develop a combined nutrition and exercise program designed to reach desired caloric intake, while maintaining appropriate intake of nutrient and fiber, sodium and fats, and appropriate energy expenditure required for the weight goal.;Weight Management: Provide education and appropriate resources to help participant work on and attain dietary goals.;Weight Management/Obesity:  Establish reasonable short term and long term weight goals.    Admit Weight 163 lb (73.9 kg)    Expected Outcomes Long Term: Adherence to nutrition and physical activity/exercise program aimed toward attainment of established weight goal;Short Term: Continue to assess and modify interventions until short term weight is achieved;Weight Loss: Understanding of general recommendations for a balanced deficit meal plan, which promotes 1-2 lb weight loss per week and includes a negative energy balance of [phone] kcal/d;Understanding recommendations for meals to include 15-35% energy as protein, 25-35% energy from fat, 35-60% energy from carbohydrates, less than 200mg  of dietary cholesterol, 20-35 gm of total fiber daily;Understanding of distribution of calorie intake throughout the day with the consumption of 4-5 meals/snacks    Diabetes Yes    Intervention Provide education about signs/symptoms and action to take for hypo/hyperglycemia.;Provide education about proper nutrition, including hydration, and aerobic/resistive exercise prescription along with prescribed medications to achieve blood glucose in normal ranges: Fasting glucose 65-99 mg/dL    Expected Outcomes Short Term: Participant verbalizes understanding of the signs/symptoms and immediate care of hyper/hypoglycemia, proper foot care and importance of medication, aerobic/resistive exercise and nutrition plan for blood glucose control.;Long Term: Attainment of HbA1C < 7%.    Hypertension Yes    Intervention Provide education on lifestyle modifcations including regular physical activity/exercise, weight management, moderate sodium restriction and increased consumption of fresh fruit, vegetables, and low fat dairy, alcohol moderation, and smoking cessation.;Monitor prescription use compliance.    Expected Outcomes Short Term: Continued assessment and intervention until BP is < 140/21mm HG in hypertensive participants. < 130/35mm HG in hypertensive  participants with diabetes, heart failure or chronic kidney disease.;Long Term: Maintenance of blood pressure at goal levels.             Education:Diabetes - Individual verbal and written instruction to review signs/symptoms of diabetes, desired ranges of glucose level fasting, after meals and with exercise. Acknowledge that pre and post exercise glucose checks will be done for 3 sessions at entry of program. Flowsheet Row Cardiac Rehab from 05/05/2023 in [facility] Cardiac and Pulmonary Rehab  Date 04/20/23  Educator [facility]  Instruction Review Code 1- Verbalizes Understanding       Core Components/Risk Factors/Patient Goals Review:   Goals and Risk Factor Review     Row Name 05/17/23 0817 06/16/23 0815           Core Components/Risk Factors/Patient Goals Review   Personal Goals Review Weight Management/Obesity;Diabetes Weight Management/Obesity;Diabetes      Review Naevia reports she has gain 7lbs since starting the program, she says she has been eating better and exercising more. She has been reducing her carb intake most days, says her BS has been lower too.  Reminded her to focus on long term success and not to be discouraged by a poor start in her weight loss journey. Also discussed and educated about fluid retention sodium and water weight as some times she reports large shifts in weight on short time frames. Niveah has gained weight since her heart event and is frustrated as she feels she has made good changes. Encouraged and supported her to stay  positive and focused on what she can do to be healthy. Eating better, exercising more, getting good sleep should be her focus right now.      Expected Outcomes STG: lose 5% CBW. LTG: achieve and maintain a healthy weight. STG: lose 5% CBW. LTG: achieve and maintain a healthy weight               Core Components/Risk Factors/Patient Goals at Discharge (Final Review):   Goals and Risk Factor Review - 06/16/23 0815       Core  Components/Risk Factors/Patient Goals Review   Personal Goals Review Weight Management/Obesity;Diabetes    Review Kymberlyn has gained weight since her heart event and is frustrated as she feels she has made good changes. Encouraged and supported her to stay positive and focused on what she can do to be healthy. Eating better, exercising more, getting good sleep should be her focus right now.    Expected Outcomes STG: lose 5% CBW. LTG: achieve and maintain a healthy weight             ITP Comments:  ITP Comments     Row Name 04/20/23 1529 04/21/23 0956 04/26/23 0750 05/05/23 0854 06/02/23 0738   ITP Comments Initial phone call completed. Diagnosis can be found in [facility] 2/21. EP Orientation scheduled for Wednesday 2/26 at 8am. Completed and gym orientation. Initial ITP created and sent for review to Dr. Firman Hughes, Medical Director. First full day of exercise!  Patient was oriented to gym and equipment including functions, settings, policies, and procedures.  Patient's individual exercise prescription and treatment plan were reviewed.  All starting workloads were established based on the results of the 6 minute walk test done at initial orientation visit.  The plan for exercise progression was also introduced and progression will be customized based on patient's performance and goals. 30 Day review completed. Medical Director ITP review done, changes made as directed, and signed approval by Medical Director. new to program 30 Day review completed. Medical Director ITP review done, changes made as directed, and signed approval by Medical Director.    Row Name 06/30/23 1255           ITP Comments 30 Day review completed. Medical Director ITP review done, changes made as directed, and signed approval by Medical Director.                Comments:

## 2023-06-30 NOTE — Progress Notes (Signed)
 Daily Session Note  Patient Details  Name: Erica Ayala MRN: [id number] Date of Birth: 23-Mar-1965 Referring Provider:   Flowsheet Row Cardiac Rehab from 04/21/2023 in [facility] Cardiac and Pulmonary Rehab  Referring Provider Isabell Manzanilla, DO       Encounter Date: 06/30/2023  Check In:  Session Check In - 06/30/23 0756       Check-In   Supervising physician immediately available to respond to emergencies See telemetry face sheet for immediately available ER MD    Location [facility]-Cardiac & Pulmonary Rehab    Staff Present Maud Sorenson, RN, BSN, CCRP;Joseph Hood RCP,RRT,BSRT;Maxon [place] BS, Exercise Physiologist;Laureen [place], BS, RRT, CPFT    Virtual Visit No    Medication changes reported     No    Fall or balance concerns reported    No    Warm-up and Cool-down Performed on first and last piece of equipment    Resistance Training Performed Yes    VAD Patient? No    PAD/SET Patient? No      Pain Assessment   Currently in Pain? No/denies                Social History   Tobacco Use  Smoking Status Never  Smokeless Tobacco Never    Goals Met:  Independence with exercise equipment Exercise tolerated well No report of concerns or symptoms today  Goals Unmet:  Not Applicable  Comments: Pt able to follow exercise prescription today without complaint.  Will continue to monitor for progression.    Dr. Firman Hughes is Medical Director for [facility] Cardiac Rehabilitation.  Dr. Fuad Aleskerov is Medical Director for [facility] Pulmonary Rehabilitation.

## 2023-07-02 ENCOUNTER — Encounter

## 2023-07-05 ENCOUNTER — Encounter

## 2023-07-07 ENCOUNTER — Encounter: Admitting: *Deleted

## 2023-07-07 DIAGNOSIS — Z955 Presence of coronary angioplasty implant and graft: Secondary | ICD-10-CM

## 2023-07-07 DIAGNOSIS — I213 ST elevation (STEMI) myocardial infarction of unspecified site: Secondary | ICD-10-CM

## 2023-07-07 NOTE — Progress Notes (Signed)
 Daily Session Note  Patient Details  Name: Erica Ayala MRN: [id number] Date of Birth: December 03, 1965 Referring Provider:   Flowsheet Row Cardiac Rehab from 04/21/2023 in [facility] Cardiac and Pulmonary Rehab  Referring Provider Isabell Manzanilla, DO       Encounter Date: 07/07/2023  Check In:  Session Check In - 07/07/23 0753       Check-In   Supervising physician immediately available to respond to emergencies See telemetry face sheet for immediately available ER MD    Location [facility]-Cardiac & Pulmonary Rehab    Staff Present Maud Sorenson, RN, BSN, CCRP;Joseph Hood RCP,RRT,BSRT;Maxon [place] BS, Exercise Physiologist;Noah Tickle, BS, Exercise Physiologist    Virtual Visit No    Medication changes reported     No    Fall or balance concerns reported    No    Warm-up and Cool-down Performed on first and last piece of equipment    Resistance Training Performed Yes    VAD Patient? No    PAD/SET Patient? No      Pain Assessment   Currently in Pain? No/denies                Social History   Tobacco Use  Smoking Status Never  Smokeless Tobacco Never    Goals Met:  Independence with exercise equipment Exercise tolerated well No report of concerns or symptoms today  Goals Unmet:  Not Applicable  Comments: Pt able to follow exercise prescription today without complaint.  Will continue to monitor for progression.    Dr. Firman Hughes is Medical Director for [facility] Cardiac Rehabilitation.  Dr. Fuad Aleskerov is Medical Director for [facility] Pulmonary Rehabilitation.

## 2023-07-09 ENCOUNTER — Encounter

## 2023-07-12 ENCOUNTER — Encounter: Admitting: *Deleted

## 2023-07-12 DIAGNOSIS — Z955 Presence of coronary angioplasty implant and graft: Secondary | ICD-10-CM

## 2023-07-12 DIAGNOSIS — I213 ST elevation (STEMI) myocardial infarction of unspecified site: Secondary | ICD-10-CM

## 2023-07-12 NOTE — Progress Notes (Signed)
 Daily Session Note  Patient Details  Name: Erica Ayala MRN: [id number] Date of Birth: [birth year] Referring Provider:   Flowsheet Row Cardiac Rehab from 04/21/2023 in [facility]  Referring Provider Isabell Manzanilla, DO       Encounter Date: 07/12/2023  Check In:  Session Check In - 07/12/23 0802       Check-In   Supervising physician immediately available to respond to emergencies See telemetry face sheet for immediately available ER MD    Location [facility]-Cardiac & Pulmonary Rehab    Staff Present Maud Sorenson, RN, BSN, CCRP;Noah Tickle, BS, Exercise Physiologist;Kelly Sabra Cramp BS, ACSM CEP, Exercise Physiologist;Jason Martina Sledge RDN,LDN    Virtual Visit No    Medication changes reported     No    Fall or balance concerns reported    No    Warm-up and Cool-down Performed on first and last piece of equipment    Resistance Training Performed Yes    VAD Patient? No    PAD/SET Patient? No      Pain Assessment   Currently in Pain? No/denies                Social History   Tobacco Use  Smoking Status Never  Smokeless Tobacco Never    Goals Met:  Independence with exercise equipment Exercise tolerated well No report of concerns or symptoms today  Goals Unmet:  Not Applicable  Comments: Pt able to follow exercise prescription today without complaint.  Will continue to monitor for progression.    Dr. Firman Hughes is Medical Director for [facility] Card[facility].  Dr. Fuad Aleskerov is Medical Director for [facility].

## 2023-07-13 ENCOUNTER — Encounter (INDEPENDENT_AMBULATORY_CARE_PROVIDER_SITE_OTHER): Payer: Self-pay

## 2023-07-14 ENCOUNTER — Encounter

## 2023-07-16 ENCOUNTER — Encounter

## 2023-07-28 ENCOUNTER — Ambulatory Visit

## 2023-07-28 ENCOUNTER — Encounter: Payer: Self-pay | Admitting: *Deleted

## 2023-07-28 DIAGNOSIS — Z955 Presence of coronary angioplasty implant and graft: Secondary | ICD-10-CM

## 2023-07-28 DIAGNOSIS — I213 ST elevation (STEMI) myocardial infarction of unspecified site: Secondary | ICD-10-CM

## 2023-07-28 NOTE — Progress Notes (Signed)
 Cardiac Individual Treatment Plan  Patient Details  Name: WENDELL FIEBIG MRN: [id number] Date of Birth: [birth year] Referring Provider:   Flowsheet Row Cardiac Rehab from 04/21/2023 in [facility] Cardiac and Pulmonary Rehab  Referring Provider Isabell Manzanilla, DO       Initial Encounter Date:  Flowsheet Row Cardiac Rehab from 04/21/2023 in [facility] Cardiac and Pulmonary Rehab  Date 04/21/23       Visit Diagnosis: Status post coronary artery stent placement  ST elevation myocardial infarction (STEMI), unspecified artery (HCC)  Patient's Home Medications on Admission:  Current Outpatient Medications:    albuterol (PROVENTIL HFA;VENTOLIN HFA) 108 (90 Base) MCG/ACT inhaler, Inhale 2 puffs into the lungs every 4 (four) hours as needed for wheezing or shortness of breath. (Patient not taking: Reported on 06/18/2023), Disp: , Rfl:    aspirin EC 81 MG tablet, Take 81 mg by mouth daily., Disp: , Rfl:    atorvastatin (LIPITOR) 80 MG tablet, Take 80 mg by mouth daily., Disp: , Rfl:    b complex vitamins capsule, Take 1 capsule by mouth daily., Disp: , Rfl:    Barberry-Oreg Grape-Goldenseal (BERBERINE COMPLEX PO), Take 1 capsule by mouth 3 (three) times daily., Disp: , Rfl:    canagliflozin (INVOKANA) 100 MG TABS tablet, Take 100 mg by mouth daily before breakfast., Disp: , Rfl:    cetirizine (ZYRTEC) 10 MG tablet, Take 10 mg by mouth daily., Disp: , Rfl:    Cholecalciferol (D 1000) 25 MCG (1000 UT) capsule, Take 1,000 Units by mouth daily., Disp: , Rfl:    Coenzyme Q10 (COQ10) 100 MG CAPS, Take 100 mg by mouth daily., Disp: , Rfl:    DULoxetine (CYMBALTA) 60 MG capsule, Take 60 mg by mouth daily., Disp: , Rfl:    Estradiol 10 MCG TABS vaginal tablet, Place 10 mcg vaginally 2 (two) times a week., Disp: , Rfl:    fluconazole (DIFLUCAN) 150 MG tablet, Take 150 mg by mouth once a week., Disp: , Rfl:    glipiZIDE (GLUCOTROL XL) 10 MG 24 hr tablet, Take 10 mg by mouth daily with breakfast., Disp: , Rfl:     metoprolol succinate (TOPROL-XL) 25 MG 24 hr tablet, Take 25 mg by mouth daily., Disp: , Rfl:    Misc Natural Products (BLOOD SUGAR BALANCE PO), Take 1 capsule by mouth 3 (three) times daily. Blood Sugar Harmony, Disp: , Rfl:    Misc Natural Products (COLON CLEANSE PO), Take 2 capsules by mouth at bedtime. Plexus Bio Cleanse 60, Disp: , Rfl:    Multiple Vitamin (MULTIVITAMIN) tablet, Take 1 tablet by mouth daily., Disp: , Rfl:    nitroGLYCERIN (NITROSTAT) 0.4 MG SL tablet, Place 0.4 mg under the tongue every 5 (five) minutes as needed for chest pain., Disp: , Rfl:    Probiotic Product (PROBIOTIC DAILY PO), Take 2 capsules by mouth at bedtime. Plexus Pro Bio 5, Disp: , Rfl:    progesterone (PROMETRIUM) 100 MG capsule, Take 100 mg by mouth daily., Disp: , Rfl:    ticagrelor (BRILINTA) 90 MG TABS tablet, Take 90 mg by mouth 2 (two) times daily., Disp: , Rfl:    urea (CARMOL) 40 % CREA, Apply 1 Application topically 2 (two) times daily., Disp: , Rfl:   Past Medical History: Past Medical History:  Diagnosis Date   Acute ST elevation myocardial infarction (STEMI) of inferior wall (HCC) 04/02/2023   a.) LHC/PCI 04/02/2023: 100% m-dRCA  --> coronary thrombectomy + 3.0 x 33 mm Xience DES   CAD (coronary artery disease)  Carpal tunnel syndrome    Cervical spondylosis    Factor 5 Leiden mutation, heterozygous (HCC)    GERD (gastroesophageal reflux disease)    Hypertension    Long term current use of aspirin    Long term current use of ticagrelor therapy    Migraines    Postmenopausal bleeding    T2DM (type 2 diabetes mellitus) (HCC)    Vertigo     Tobacco Use: Social History   Tobacco Use  Smoking Status Never  Smokeless Tobacco Never    Labs: Review Flowsheet        No data to display           Exercise Target Goals: Exercise Program Goal: Individual exercise prescription set using results from initial 6 min walk test and THRR while considering  patient's activity barriers  and safety.   Exercise Prescription Goal: Initial exercise prescription builds to 30-45 minutes a day of aerobic activity, 2-3 days per week.  Home exercise guidelines will be given to patient during program as part of exercise prescription that the participant will acknowledge.   Education: Aerobic Exercise: - Group verbal and visual presentation on the components of exercise prescription. Introduces F.I.T.T principle from ACSM for exercise prescriptions.  Reviews F.I.T.T. principles of aerobic exercise including progression. Written material given at graduation.   Education: Resistance Exercise: - Group verbal and visual presentation on the components of exercise prescription. Introduces F.I.T.T principle from ACSM for exercise prescriptions  Reviews F.I.T.T. principles of resistance exercise including progression. Written material given at graduation.    Education: Exercise & Equipment Safety: - Individual verbal instruction and demonstration of equipment use and safety with use of the equipment. Flowsheet Row Cardiac Rehab from 05/05/2023 in [facility] Cardiac and Pulmonary Rehab  Date 04/21/23  Educator NT  Instruction Review Code 1- Verbalizes Understanding       Education: Exercise Physiology & General Exercise Guidelines: - Group verbal and written instruction with models to review the exercise physiology of the cardiovascular system and associated critical values. Provides general exercise guidelines with specific guidelines to those with heart or lung disease.    Education: Flexibility, Balance, Mind/Body Relaxation: - Group verbal and visual presentation with interactive activity on the components of exercise prescription. Introduces F.I.T.T principle from ACSM for exercise prescriptions. Reviews F.I.T.T. principles of flexibility and balance exercise training including progression. Also discusses the mind body connection.  Reviews various relaxation techniques to help reduce and  manage stress (i.e. Deep breathing, progressive muscle relaxation, and visualization). Balance handout provided to take home. Written material given at graduation. Flowsheet Row Cardiac Rehab from 05/05/2023 in [facility] Cardiac and Pulmonary Rehab  Date 05/05/23  Educator NT  Instruction Review Code 1- Verbalizes Understanding       Activity Barriers & Risk Stratification:  Activity Barriers & Cardiac Risk Stratification - 04/20/23 1503       Activity Barriers & Cardiac Risk Stratification   Activity Barriers Back Problems;Other (comment)    Comments neuropathy    Cardiac Risk Stratification Moderate             6 Minute Walk:  6 Minute Walk     Row Name 04/21/23 1001         6 Minute Walk   Phase Initial     Distance 1240 feet     Walk Time 6 minutes     # of Rest Breaks 0     MPH 2.35     METS 3.43  RPE 9     Perceived Dyspnea  0     VO2 Peak 12.01     Symptoms Yes (comment)     Comments Feet tingling     Resting HR 71 bpm     Resting BP 112/64     Resting Oxygen Saturation  97 %     Exercise Oxygen Saturation  during 6 min walk 99 %     Max Ex. HR 93 bpm     Max Ex. BP 128/66     2 Minute Post BP 116/64              Oxygen Initial Assessment:   Oxygen Re-Evaluation:   Oxygen Discharge (Final Oxygen Re-Evaluation):   Initial Exercise Prescription:  Initial Exercise Prescription - 04/21/23 1000       Date of Initial Exercise RX and Referring Provider   Date 04/21/23    Referring Provider Lanell Pinta Custovic, DO      Oxygen   Maintain Oxygen Saturation 88% or higher      Treadmill   MPH 2.3    Grade 1    Minutes 15    METs 3.08      NuStep   Level 3    SPM 80    Minutes 15    METs 3.43      REL-XR   Level 3    Speed 50    Minutes 15    METs 3.43      Prescription Details   Frequency (times per week) 3    Duration Progress to 30 minutes of continuous aerobic without signs/symptoms of physical distress      Intensity   THRR  40-80% of Max Heartrate 108-146    Ratings of Perceived Exertion 11-13    Perceived Dyspnea 0-4      Progression   Progression Continue to progress workloads to maintain intensity without signs/symptoms of physical distress.      Resistance Training   Training Prescription Yes    Weight 4 lb    Reps 10-15             Perform Capillary Blood Glucose checks as needed.  Exercise Prescription Changes:   Exercise Prescription Changes     Row Name 04/21/23 1000 05/10/23 1500 05/26/23 1100 06/10/23 0800 06/23/23 0700     Response to Exercise   Blood Pressure (Admit) 112/64 126/66 118/64 128/70 --   Blood Pressure (Exercise) 128/66 148/74 158/82 138/60 --   Blood Pressure (Exit) 116/64 118/58 110/60 102/60 --   Heart Rate (Admit) 71 bpm 97 bpm 75 bpm 75 bpm --   Heart Rate (Exercise) 93 bpm 128 bpm 121 bpm 121 bpm --   Heart Rate (Exit) 76 bpm 82 bpm 83 bpm 84 bpm --   Oxygen Saturation (Admit) 97 % -- -- -- --   Oxygen Saturation (Exercise) 99 % -- -- -- --   Rating of Perceived Exertion (Exercise) 9 14 14 14  --   Perceived Dyspnea (Exercise) 0 -- -- -- --   Symptoms feet tingling none none none --   Comments Results First two weeks of exercise -- -- --   Duration -- Continue with 30 min of aerobic exercise without signs/symptoms of physical distress. Continue with 30 min of aerobic exercise without signs/symptoms of physical distress. Continue with 30 min of aerobic exercise without signs/symptoms of physical distress. --   Intensity -- THRR unchanged THRR unchanged THRR unchanged --     Progression  Progression -- Continue to progress workloads to maintain intensity without signs/symptoms of physical distress. Continue to progress workloads to maintain intensity without signs/symptoms of physical distress. Continue to progress workloads to maintain intensity without signs/symptoms of physical distress. --   Average METs -- 3.17 3.56 3.65 --     Resistance Training    Training Prescription -- Yes Yes Yes --   Weight -- 4 lb 4 lb 5 lb --   Reps -- 10-15 10-15 10-15 --     Interval Training   Interval Training -- No No No --     Treadmill   MPH -- 3 2.9 3 --   Grade -- 2.5 2.5 2.5 --   Minutes -- 15 15 15  --   METs -- 4.33 4.22 4.33 --     NuStep   Level -- 5  T6 5  T6 5 --   Minutes -- 15 15 15  --   METs -- 2.7 2.3 2.6 --     REL-XR   Level -- 4 5 6  --   Minutes -- 15 15 15  --   METs -- 3.8 4 4.2 --     T5 Nustep   Level -- -- -- 7  T6 --   SPM -- -- -- 80 --   Minutes -- -- -- 15 --   METs -- -- -- 2.3 --     Home Exercise Plan   Plans to continue exercise at -- -- -- -- Home (comment)   Frequency -- -- -- -- Add 2 additional days to program exercise sessions.   Initial Home Exercises Provided -- -- -- -- 06/23/23     Oxygen   Maintain Oxygen Saturation -- 88% or higher 88% or higher 88% or higher 88% or higher    Row Name 06/24/23 0900 07/08/23 1500 07/20/23 1300         Response to Exercise   Blood Pressure (Admit) 118/64 104/60 128/82     Blood Pressure (Exit) 104/62 128/78 104/62     Heart Rate (Admit) 67 bpm 76 bpm 73 bpm     Heart Rate (Exercise) 140 bpm 108 bpm 110 bpm     Heart Rate (Exit) 77 bpm 71 bpm 78 bpm     Oxygen Saturation (Admit) 95 % -- --     Oxygen Saturation (Exercise) 93 % -- --     Oxygen Saturation (Exit) 98 % -- --     Rating of Perceived Exertion (Exercise) 17 12 12      Symptoms none none none     Duration Continue with 30 min of aerobic exercise without signs/symptoms of physical distress. Continue with 30 min of aerobic exercise without signs/symptoms of physical distress. Continue with 30 min of aerobic exercise without signs/symptoms of physical distress.     Intensity THRR unchanged THRR unchanged THRR unchanged       Progression   Progression Continue to progress workloads to maintain intensity without signs/symptoms of physical distress. Continue to progress workloads to maintain intensity  without signs/symptoms of physical distress. Continue to progress workloads to maintain intensity without signs/symptoms of physical distress.     Average METs 3.44 4.78 3.87       Resistance Training   Training Prescription Yes Yes Yes     Weight 5 lb 5 lb 5 lb     Reps 10-15 10-15 10-15       Interval Training   Interval Training No No No  Treadmill   MPH 3.2 3 3      Grade 2.5 3 3      Minutes 15 15 15      METs 4.55 4.54 4.54       NuStep   Level 6  T6 -- 8  T6     Minutes 15 -- 15     METs 2.6 -- 3.2       REL-XR   Level -- 7 --     Minutes -- 15 --     METs -- 5.8 --       T5 Nustep   Level 6 -- 7     Minutes 15 -- 15     METs 2.6 -- 3.2       Home Exercise Plan   Plans to continue exercise at Home (comment) Home (comment) Home (comment)     Frequency Add 2 additional days to program exercise sessions. Add 2 additional days to program exercise sessions. Add 2 additional days to program exercise sessions.     Initial Home Exercises Provided 06/23/23 06/23/23 06/23/23       Oxygen   Maintain Oxygen Saturation 88% or higher 88% or higher 88% or higher              Exercise Comments:   Exercise Comments     Row Name 04/26/23 0751           Exercise Comments First full day of exercise!  Patient was oriented to gym and equipment including functions, settings, policies, and procedures.  Patient's individual exercise prescription and treatment plan were reviewed.  All starting workloads were established based on the results of the 6 minute walk test done at initial orientation visit.  The plan for exercise progression was also introduced and progression will be customized based on patient's performance and goals.                Exercise Goals and Review:   Exercise Goals     Row Name 04/21/23 1000             Exercise Goals   Increase Physical Activity Yes       Intervention Provide advice, education, support and counseling about physical  activity/exercise needs.;Develop an individualized exercise prescription for aerobic and resistive training based on initial evaluation findings, risk stratification, comorbidities and participant's personal goals.       Expected Outcomes Short Term: Attend rehab on a regular basis to increase amount of physical activity.;Long Term: Add in home exercise to make exercise part of routine and to increase amount of physical activity.;Long Term: Exercising regularly at least 3-5 days a week.       Increase Strength and Stamina Yes       Intervention Provide advice, education, support and counseling about physical activity/exercise needs.;Develop an individualized exercise prescription for aerobic and resistive training based on initial evaluation findings, risk stratification, comorbidities and participant's personal goals.       Expected Outcomes Short Term: Increase workloads from initial exercise prescription for resistance, speed, and METs.;Long Term: Improve cardiorespiratory fitness, muscular endurance and strength as measured by increased METs and functional capacity ( );Short Term: Perform resistance training exercises routinely during rehab and add in resistance training at home       Able to understand and use rate of perceived exertion (RPE) scale Yes       Intervention Provide education and explanation on how to use RPE scale       Expected Outcomes Short  Term: Able to use RPE daily in rehab to express subjective intensity level;Long Term:  Able to use RPE to guide intensity level when exercising independently       Able to understand and use Dyspnea scale Yes       Intervention Provide education and explanation on how to use Dyspnea scale       Expected Outcomes Short Term: Able to use Dyspnea scale daily in rehab to express subjective sense of shortness of breath during exertion;Long Term: Able to use Dyspnea scale to guide intensity level when exercising independently       Knowledge and  understanding of Target Heart Rate Range (THRR) Yes       Intervention Provide education and explanation of THRR including how the numbers were predicted and where they are located for reference       Expected Outcomes Short Term: Able to state/look up THRR;Long Term: Able to use THRR to govern intensity when exercising independently;Short Term: Able to use daily as guideline for intensity in rehab       Able to check pulse independently Yes       Intervention Review the importance of being able to check your own pulse for safety during independent exercise;Provide education and demonstration on how to check pulse in carotid and radial arteries.       Expected Outcomes Short Term: Able to explain why pulse checking is important during independent exercise;Long Term: Able to check pulse independently and accurately       Understanding of Exercise Prescription Yes       Intervention Provide education, explanation, and written materials on patient's individual exercise prescription       Expected Outcomes Long Term: Able to explain home exercise prescription to exercise independently;Short Term: Able to explain program exercise prescription                Exercise Goals Re-Evaluation :  Exercise Goals Re-Evaluation     Row Name 04/26/23 0751 05/10/23 1544 05/17/23 0759 05/24/23 0805 05/26/23 1142     Exercise Goal Re-Evaluation   Exercise Goals Review Increase Physical Activity;Able to understand and use rate of perceived exertion (RPE) scale;Knowledge and understanding of Target Heart Rate Range (THRR);Understanding of Exercise Prescription;Increase Strength and Stamina;Able to check pulse independently Increase Physical Activity;Understanding of Exercise Prescription;Increase Strength and Stamina Increase Physical Activity;Increase Strength and Stamina;Understanding of Exercise Prescription Increase Physical Activity;Understanding of Exercise Prescription;Increase Strength and Stamina Increase  Physical Activity;Understanding of Exercise Prescription;Increase Strength and Stamina   Comments Reviewed RPE and dyspnea scale, THR and program prescription with pt today.  Pt voiced understanding and was given a copy of goals to take home. Anwen is off to a good start in the program. She completed her first 2 weeks of exercise in this review. She has tolerated her exercise program well. She already increased her workloads. She increased her workload on the treadmill to a speed of 3 mph and incline of 2.5%. She also increased to level 5 on the T6 nustep and level 4 on the XR. We will continue to monitor her progress in the program. Harveen is doing well here at rehab, she reports seeing some improvement in strength and stamina. She is walking at home on nice days. She is doing some stretches too. Encouraged her to include some hand weights or resistance bands. 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 Timmia stated that her husband just bought an  elliptical and was interested in using it at home. She has never used on before and she was encouraged to try the EL in cardiac rehab class first to see how she tolerated it and slowing build up her endurance on a more challenging machine before using it at home. Twilia is doing well in rehab. She was able to increase to level 5 on the XR. She was also able to maintain her relative workload on the treadmill, and the T6 nustep. We will continue to monitor her progress in the program.   Expected Outcomes Short: Use RPE daily to regulate intensity.  Long: Follow program prescription in THR. Short: Continue to follow current exercise prescription. Long: Continue exercise to improve strength and stamina. STG: continue to attend cardiac rehab and include more weights and resistance bands during stretchs. LTG: Continue to exercise to improve strength and stamina Short: try to use the EL in class to build up to 20 min. Long: be able to  use EL at home for home exercise. Short: Continue to increase treadmill workload. Long: Continue exercise to improve strength and stamina.    Row Name 06/10/23 1610 06/16/23 0808 06/23/23 0751 06/24/23 0937 07/08/23 1554     Exercise Goal Re-Evaluation   Exercise Goals Review Increase Physical Activity;Understanding of Exercise Prescription;Increase Strength and Stamina Increase Physical Activity;Increase Strength and Stamina;Understanding of Exercise Prescription Increase Physical Activity;Increase Strength and Stamina;Understanding of Exercise Prescription;Knowledge and understanding of Target Heart Rate Range (THRR);Able to understand and use Dyspnea scale;Able to understand and use rate of perceived exertion (RPE) scale;Able to check pulse independently Increase Strength and Stamina;Increase Physical Activity;Understanding of Exercise Prescription Increase Strength and Stamina;Increase Physical Activity;Understanding of Exercise Prescription   Comments Shirlyn is doing well in rehab. She increased to level 6 on the XR and level 7 on the T6 nustep. She was also able to increase her speed on the treadmill to 3 mph with 2.5% incline. She also increased to 5 lb handweights for resistance. We will continue to monitor her progress in the program. Jaelee is doing well here at rehab. She has been on the T6, treadmill and did some on the elliptical but found it challenging. She has one at home and would like to start working on improving at it. Encouraged her to continue to work with it here at rehab. She is doing hand weights, stretches and walks at home as exercise. Reviewed home exercise with pt today.  Pt plans to walk at the area around her home and use her personal elliptical and stationary bike for exercise.  Reviewed THR, pulse, RPE, sign and symptoms, pulse oximetery and when to call 911 or MD.  Also discussed weather considerations and indoor options.  Pt voiced understanding. Reshunda is doing well in  rehab. She was able to increase her speed on the treadmill from 3 to 3.2 mph. She was also able to increase from level5 to 6 on the T6 nustep. We will continue to monitor her progress in the program. Makynna is doing well in rehab. She was able to increase her incline on the treadmill from 2.5% to 3% while maintaining her speed at 3 mph. She was also able to improve to level 7 on the XR. We will continue to monitor her progress in the program.   Expected Outcomes Short: Continue to progressively increase treadmill, XR , and nustep workloads. Long: Continue exercise to improve strength and stamina. STG: Increase workload on treadmill, XR, and elliptical. LTG Continue exercise to improve strength  and stamina. Short: Begin exercising at home on days away from rehab. Long: Continue to exercise independently. Short: Continue to increase treadmill workload. Long: Continue exercise to improve strength and stamina. Short: Continue to progressively increase treadmill workload. Long: Continue exercise to improve strength and stamina.    Row Name 07/12/23 0751 07/20/23 1401           Exercise Goal Re-Evaluation   Exercise Goals Review Increase Physical Activity;Increase Strength and Stamina;Understanding of Exercise Prescription Increase Physical Activity;Increase Strength and Stamina;Understanding of Exercise Prescription      Comments Terren is doing well at rehab. She is walking at home for exercise when she has time. She is doing hand weight exercises and stretches. She has a eliptical, but struggles to get on it consistently, saying it tires her out quickly. She wants to get on it more to see if she can do more time on it before getting tired. Athaliah continues to do well in rehab. She continues to walk at 3 mph with a 3% incline on the treadmill. She also improved to level 7 on the T5 nustep and level 8 on the T6 nustep. We will continue to monitor her progress in the program.      Expected Outcomes STG:  Continue to increase workload at rehab, get on eliptical more at home. LTG: Continue exercise to improve strength and stamina. Short: Continue to progressively increase treadmill workload. Long: Continue exercise to improve strength and stamina.               Discharge Exercise Prescription (Final Exercise Prescription Changes):  Exercise Prescription Changes - 07/20/23 1300       Response to Exercise   Blood Pressure (Admit) 128/82    Blood Pressure (Exit) 104/62    Heart Rate (Admit) 73 bpm    Heart Rate (Exercise) 110 bpm    Heart Rate (Exit) 78 bpm    Rating of Perceived Exertion (Exercise) 12    Symptoms none    Duration Continue with 30 min of aerobic exercise without signs/symptoms of physical distress.    Intensity THRR unchanged      Progression   Progression Continue to progress workloads to maintain intensity without signs/symptoms of physical distress.    Average METs 3.87      Resistance Training   Training Prescription Yes    Weight 5 lb    Reps 10-15      Interval Training   Interval Training No      Treadmill   MPH 3    Grade 3    Minutes 15    METs 4.54      NuStep   Level 8   T6   Minutes 15    METs 3.2      T5 Nustep   Level 7    Minutes 15    METs 3.2      Home Exercise Plan   Plans to continue exercise at Home (comment)    Frequency Add 2 additional days to program exercise sessions.    Initial Home Exercises Provided 06/23/23      Oxygen   Maintain Oxygen Saturation 88% or higher             Nutrition:  Target Goals: Understanding of nutrition guidelines, daily intake of sodium 1500mg , cholesterol 200mg , calories 30% from fat and 7% or less from saturated fats, daily to have 5 or more servings of fruits and vegetables.  Education: All About Nutrition: -Group instruction provided by  verbal, written material, interactive activities, discussions, models, and posters to present general guidelines for heart healthy nutrition  including fat, fiber, MyPlate, the role of sodium in heart healthy nutrition, utilization of the nutrition label, and utilization of this knowledge for meal planning. Follow up email sent as well. Written material given at graduation. Flowsheet Row Cardiac Rehab from 05/05/2023 in [facility] Cardiac and Pulmonary Rehab  Education need identified 04/21/23       Biometrics:  Pre Biometrics - 04/21/23 1001       Pre Biometrics   Height 5' 4.75" (1.645 m)    Weight 169 lb 8 oz (76.9 kg)    Waist Circumference 37 inches    Hip Circumference 39 inches    Waist to Hip Ratio 0.95 %    BMI (Calculated) 28.41    Single Leg Stand 5.2 seconds              Nutrition Therapy Plan and Nutrition Goals:  Nutrition Therapy & Goals - 04/21/23 1104       Nutrition Therapy   Diet Carb controlled    Protein (specify units) 90    Fiber 25 grams    Whole Grain Foods 3 servings    Saturated Fats 15 max. grams    Fruits and Vegetables 5 servings/day    Sodium 2 grams      Personal Nutrition Goals   Nutrition Goal Eat 15-30gProtein and 30-60gCarbs at each meal.    Personal Goal #2 Read labels and reduce sodium intake to below 2300mg . Ideally 1500mg  per day.    Personal Goal #3 Reduce saturated fat, less than 12g per day. Replace bad fats for more heart healthy fats.    Comments Patient is not drinking sugary beverages. She and her mother have DM, so she is carb conscious. Tries to pick complex carbs and keep them controlled. She was not aware of how many carbs to eat at each meal. Educated on carb goal of 30-60g per meal, with snacks roughly half as much. Her A1C was 11% 2 weeks ago, which was alarming. Spoke with her, she reports her life became stressful with several family members in and out of the hospital. She also reports her Dr placed her on a different medication for her DM. Encouraged her to communicate with Dr about her blood sugars, and to continue to make good carb choices. Educated on  [employer], types of fats, sources, and how to read labels. Went over sodium and its effects on blood pressure and heart health. Build out several small meals and snacks that are easier to make for when life gets suddenly busy.      Intervention Plan   Intervention Prescribe, educate and counsel regarding individualized specific dietary modifications aiming towards targeted core components such as weight, hypertension, lipid management, diabetes, heart failure and other comorbidities.;Nutrition handout(s) given to patient.    Expected Outcomes Short Term Goal: Understand basic principles of dietary content, such as calories, fat, sodium, cholesterol and nutrients.;Short Term Goal: A plan has been developed with personal nutrition goals set during dietitian appointment.;Long Term Goal: Adherence to prescribed nutrition plan.             Nutrition Assessments:  MEDIFICTS Score Key: >=70 Need to make dietary changes  40-70 Heart Healthy Diet <= 40 Therapeutic Level Cholesterol Diet  Flowsheet Row Cardiac Rehab from 04/21/2023 in [facility] Cardiac and Pulmonary Rehab  Picture Your Plate Total Score on Admission 68      Picture Your Plate  Scores: <40 Unhealthy dietary pattern with much room for improvement. 41-50 Dietary pattern unlikely to meet recommendations for good health and room for improvement. 51-60 More healthful dietary pattern, with some room for improvement.  >60 Healthy dietary pattern, although there may be some specific behaviors that could be improved.    Nutrition Goals Re-Evaluation:  Nutrition Goals Re-Evaluation     Row Name 05/17/23 0810 06/16/23 0813 07/12/23 0756         Goals   Comment Nolan reports she is doing better with controlling her BS. not every day is a good day, but says she has more good days than bad as of late. Encouraged her to continue check her BS, eat contorlled portions of carbs and comminucate with Dr about her meds and dosages. Marilyne  reports she is packing healthier lunches with more veggies and fruits to take to work. Says he blood sugars have been "okay" but wants to talk with her Dr about medication adjustments. Mystie says she is still packing healthier lunches to work. Says she spoke with her Dr about medication adjustments and her blood sugars have been much better.     Expected Outcome STG: Check BS and communicate with doctor. LTG: Manage BS independently and bring A1C down to below 7% STG: Check BS, continue to pack healthier lunches. LTG Manage BS independently and bring A1C down to below 7% STG: continue to pack healthier lunches. LTG: Manage BS independently and bring A1C down to below 7%              Nutrition Goals Discharge (Final Nutrition Goals Re-Evaluation):  Nutrition Goals Re-Evaluation - 07/12/23 0756       Goals   Comment Kadeisha says she is still packing healthier lunches to work. Says she spoke with her Dr about medication adjustments and her blood sugars have been much better.    Expected Outcome STG: continue to pack healthier lunches. LTG: Manage BS independently and bring A1C down to below 7%             Psychosocial: Target Goals: Acknowledge presence or absence of significant depression and/or stress, maximize coping skills, provide positive support system. Participant is able to verbalize types and ability to use techniques and skills needed for reducing stress and depression.   Education: Stress, Anxiety, and Depression - Group verbal and visual presentation to define topics covered.  Reviews how body is impacted by stress, anxiety, and depression.  Also discusses healthy ways to reduce stress and to treat/manage anxiety and depression.  Written material given at graduation.   Education: Sleep Hygiene -Provides group verbal and written instruction about how sleep can affect your health.  Define sleep hygiene, discuss sleep cycles and impact of sleep habits. Review good sleep hygiene  tips.    Initial Review & Psychosocial Screening:  Initial Psych Review & Screening - 04/20/23 1511       Initial Review   Current issues with Current Sleep Concerns;Current Stress Concerns    Source of Stress Concerns Family      Family Dynamics   Good Support System? Yes   husband, family     Barriers   Psychosocial barriers to participate in program There are no identifiable barriers or psychosocial needs.;The patient should benefit from training in stress management and relaxation.      Screening Interventions   Interventions Encouraged to exercise;Provide feedback about the scores to participant;To provide support and resources with identified psychosocial needs    Expected Outcomes Short Term  goal: Utilizing psychosocial counselor, staff and physician to assist with identification of specific Stressors or current issues interfering with healing process. Setting desired goal for each stressor or current issue identified.;Long Term Goal: Stressors or current issues are controlled or eliminated.;Short Term goal: Identification and review with participant of any Quality of Life or Depression concerns found by scoring the questionnaire.;Long Term goal: The participant improves quality of Life and PHQ9 Scores as seen by post scores and/or verbalization of changes             Quality of Life Scores:   Quality of Life - 04/21/23 0958       Quality of Life   Select Quality of Life      Quality of Life Scores   Health/Function Pre 16.4 %    Socioeconomic Pre 23.63 %    Psych/Spiritual Pre 28.29 %    Family Pre 25.2 %    GLOBAL Pre 21.69 %            Scores of 19 and below usually indicate a poorer quality of life in these areas.  A difference of  2-3 points is a clinically meaningful difference.  A difference of 2-3 points in the total score of the Quality of Life Index has been associated with significant improvement in overall quality of life, self-image, physical  symptoms, and general health in studies assessing change in quality of life.  PHQ-9: Review Flowsheet       05/17/2023 04/21/2023 02/27/2016  Depression screen PHQ 2/9  Decreased Interest 0 1 0  Down, Depressed, Hopeless 1 1 1   PHQ - 2 Score 1 2 1   Altered sleeping 1 2 -  Tired, decreased energy 1 3 -  Change in appetite 0 0 -  Feeling bad or failure about yourself  0 0 -  Trouble concentrating 0 1 -  Moving slowly or fidgety/restless 0 0 -  Suicidal thoughts 0 0 -  PHQ-9 Score 3 8 -  Difficult doing work/chores Not difficult at all Somewhat difficult -   Interpretation of Total Score  Total Score Depression Severity:  1-4 = Minimal depression, 5-9 = Mild depression, 10-14 = Moderate depression, 15-19 = Moderately severe depression, 20-27 = Severe depression   Psychosocial Evaluation and Intervention:  Psychosocial Evaluation - 04/20/23 1521       Psychosocial Evaluation & Interventions   Interventions Encouraged to exercise with the program and follow exercise prescription;Stress management education;Relaxation education    Comments 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 She had her MI while visiting her hospitalized father. She is helping care for her dad and her mother. Her husband and her also help care for her mother in law who had a stroke soon after Conley's father-in-law passed away in 08-Dec-2023. In addition to her parents and in law stress, she also mentions she helps with her husband who is a 100% disabled veteran who struggles with PTSD. 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 Today they also had to put down their family dog. She admits that this all has been a tremendous amount of stress for her and she knows she has to take care of herself. Her duloxetine is helping and she is trying lifestyle changes as well.  She is looking forward to the classes and developing a consistent exercise  routine. She is very thankful for her support system and ability to recover.  Expected Outcomes Short: attend cardiac rehab for education and exercise. Long: develop and maintain positive self care habits    Continue Psychosocial Services  Follow up required by staff             Psychosocial Re-Evaluation:  Psychosocial Re-Evaluation     Row Name 05/17/23 0804 06/16/23 0811 07/12/23 0753         Psychosocial Re-Evaluation   Current issues with Current Sleep Concerns Current Sleep Concerns Current Stress Concerns     Comments Brooklee reports she is doing well with mental health. She does experience stress from fathers health, school, and social life. But she has good coping mechanisms with hobbies like piano, cross stitching and walking. She reports sleep difficulites on some nights, spoke with her Dr and was prescribed melatonin, just started it, will continue to monitor. Mariamawit denies any anxeity, depression, or stress. Says she is dealing with caring for parents during their health declines but will have more free time to care for them in a few months. She is not sleeping well, has been working with Dr to find a medication to help Elinore denies any anxiety or depression. She says she is caring for her parents as their health declines which is causing a lot of stress. She reports she has a good support system with family and friends. She says she has been sleeping better, ~8-9hrs a night.     Expected Outcomes STG: try melotonin and communicate with Dr about effectiveness. LTG: Maintain positive outlook on health and daily life STG: Practice good sleep hygiene. LTG: Maintain positive outlook on health and daily life STG: Continue to focus on good sleep. LTG: Maintain positive outlook on health and daily life     Interventions Encouraged to attend Cardiac Rehabilitation for the exercise Encouraged to attend Cardiac Rehabilitation for the exercise Encouraged to attend Cardiac Rehabilitation for  the exercise     Continue Psychosocial Services  Follow up required by staff Follow up required by staff Follow up required by staff       Initial Review   Source of Stress Concerns -- -- Family              Psychosocial Discharge (Final Psychosocial Re-Evaluation):  Psychosocial Re-Evaluation - 07/12/23 0753       Psychosocial Re-Evaluation   Current issues with Current Stress Concerns    Comments Jennilee denies any anxiety or depression. She says she is caring for her parents as their health declines which is causing a lot of stress. She reports she has a good support system with family and friends. She says she has been sleeping better, ~8-9hrs a night.    Expected Outcomes STG: Continue to focus on good sleep. LTG: Maintain positive outlook on health and daily life    Interventions Encouraged to attend Cardiac Rehabilitation for the exercise    Continue Psychosocial Services  Follow up required by staff      Initial Review   Source of Stress Concerns Family             Vocational Rehabilitation: Provide vocational rehab assistance to qualifying candidates.   Vocational Rehab Evaluation & Intervention:  Vocational Rehab - 04/20/23 1510       Initial Vocational Rehab Evaluation & Intervention   Assessment shows need for Vocational Rehabilitation No             Education: Education Goals: Education classes will be provided on a variety of topics geared toward better understanding of heart  health and risk factor modification. Participant will state understanding/return demonstration of topics presented as noted by education test scores.  Learning Barriers/Preferences:  Learning Barriers/Preferences - 04/20/23 1510       Learning Barriers/Preferences   Learning Barriers None    Learning Preferences None             General Cardiac Education Topics:  AED/CPR: - Group verbal and written instruction with the use of models to demonstrate the basic use of  the AED with the basic ABC's of resuscitation.   Anatomy and Cardiac Procedures: - Group verbal and visual presentation and models provide information about basic cardiac anatomy and function. Reviews the testing methods done to diagnose heart disease and the outcomes of the test results. Describes the treatment choices: Medical Management, Angioplasty, or Coronary Bypass Surgery for treating various heart conditions including Myocardial Infarction, Angina, Valve Disease, and Cardiac Arrhythmias.  Written material given at graduation. Flowsheet Row Cardiac Rehab from 05/05/2023 in [facility] Cardiac and Pulmonary Rehab  Education need identified 04/21/23       Medication Safety: - Group verbal and visual instruction to review commonly prescribed medications for heart and lung disease. Reviews the medication, class of the drug, and side effects. Includes the steps to properly store meds and maintain the prescription regimen.  Written material given at graduation.   Intimacy: - Group verbal instruction through game format to discuss how heart and lung disease can affect sexual intimacy. Written material given at graduation..   Know Your Numbers and Heart Failure: - Group verbal and visual instruction to discuss disease risk factors for cardiac and pulmonary disease and treatment options.  Reviews associated critical values for Overweight/Obesity, Hypertension, Cholesterol, and Diabetes.  Discusses basics of heart failure: signs/symptoms and treatments.  Introduces Heart Failure Zone chart for action plan for heart failure.  Written material given at graduation.   Infection Prevention: - Provides verbal and written material to individual with discussion of infection control including proper hand washing and proper equipment cleaning during exercise session. Flowsheet Row Cardiac Rehab from 05/05/2023 in [facility] Cardiac and Pulmonary Rehab  Date 04/21/23  Educator NT  Instruction Review Code 1-  Verbalizes Understanding       Falls Prevention: - Provides verbal and written material to individual with discussion of falls prevention and safety. Flowsheet Row Cardiac Rehab from 05/05/2023 in [facility] Cardiac and Pulmonary Rehab  Date 04/21/23  Educator NT  Instruction Review Code 1- Verbalizes Understanding       Other: -Provides group and verbal instruction on various topics (see comments)   Knowledge Questionnaire Score:  Knowledge Questionnaire Score - 04/21/23 1610       Knowledge Questionnaire Score   Pre Score 24/26             Core Components/Risk Factors/Patient Goals at Admission:  Personal Goals and Risk Factors at Admission - 04/20/23 1504       Core Components/Risk Factors/Patient Goals on Admission    Weight Management Yes;Weight Loss    Intervention Weight Management: Develop a combined nutrition and exercise program designed to reach desired caloric intake, while maintaining appropriate intake of nutrient and fiber, sodium and fats, and appropriate energy expenditure required for the weight goal.;Weight Management: Provide education and appropriate resources to help participant work on and attain dietary goals.;Weight Management/Obesity: Establish reasonable short term and long term weight goals.    Admit Weight 163 lb (73.9 kg)    Expected Outcomes Long Term: Adherence to nutrition and physical activity/exercise program  aimed toward attainment of established weight goal;Short Term: Continue to assess and modify interventions until short term weight is achieved;Weight Loss: Understanding of general recommendations for a balanced deficit meal plan, which promotes 1-2 lb weight loss per week and includes a negative energy balance of [phone] kcal/d;Understanding recommendations for meals to include 15-35% energy as protein, 25-35% energy from fat, 35-60% energy from carbohydrates, less than 200mg  of dietary cholesterol, 20-35 gm of total fiber daily;Understanding  of distribution of calorie intake throughout the day with the consumption of 4-5 meals/snacks    Diabetes Yes    Intervention Provide education about signs/symptoms and action to take for hypo/hyperglycemia.;Provide education about proper nutrition, including hydration, and aerobic/resistive exercise prescription along with prescribed medications to achieve blood glucose in normal ranges: Fasting glucose 65-99 mg/dL    Expected Outcomes Short Term: Participant verbalizes understanding of the signs/symptoms and immediate care of hyper/hypoglycemia, proper foot care and importance of medication, aerobic/resistive exercise and nutrition plan for blood glucose control.;Long Term: Attainment of HbA1C < 7%.    Hypertension Yes    Intervention Provide education on lifestyle modifcations including regular physical activity/exercise, weight management, moderate sodium restriction and increased consumption of fresh fruit, vegetables, and low fat dairy, alcohol moderation, and smoking cessation.;Monitor prescription use compliance.    Expected Outcomes Short Term: Continued assessment and intervention until BP is < 140/70mm HG in hypertensive participants. < 130/80mm HG in hypertensive participants with diabetes, heart failure or chronic kidney disease.;Long Term: Maintenance of blood pressure at goal levels.             Education:Diabetes - Individual verbal and written instruction to review signs/symptoms of diabetes, desired ranges of glucose level fasting, after meals and with exercise. Acknowledge that pre and post exercise glucose checks will be done for 3 sessions at entry of program. Flowsheet Row Cardiac Rehab from 05/05/2023 in [facility] Cardiac and Pulmonary Rehab  Date 04/20/23  Educator [facility]  Instruction Review Code 1- Verbalizes Understanding       Core Components/Risk Factors/Patient Goals Review:   Goals and Risk Factor Review     Row Name 05/17/23 0817 06/16/23 0815 07/12/23 0757          Core Components/Risk Factors/Patient Goals Review   Personal Goals Review Weight Management/Obesity;Diabetes Weight Management/Obesity;Diabetes Weight Management/Obesity;Diabetes     Review Zorianna reports she has gain 7lbs since starting the program, she says she has been eating better and exercising more. She has been reducing her carb intake most days, says her BS has been lower too.  Reminded her to focus on long term success and not to be discouraged by a poor start in her weight loss journey. Also discussed and educated about fluid retention sodium and water weight as some times she reports large shifts in weight on short time frames. Adeli has gained weight since her heart event and is frustrated as she feels she has made good changes. Encouraged and supported her to stay positive and focused on what she can do to be healthy. Eating better, exercising more, getting good sleep should be her focus right now. Donnajean is still working on losing weight and eating better. She is making good changes and her Blood sugars have been better controlled since. She has not lost weight since making the changes. Encouraged her to continue to eat healthy, exercise and give wieght loss goals some time to progress.     Expected Outcomes STG: lose 5% CBW. LTG: achieve and maintain a healthy weight. STG: lose 5%  CBW. LTG: achieve and maintain a healthy weight STG: Lose 5% CBW (lose 9lbs, STG goal weight of 169lbs). LTG: achieve and maintain a healthy weight              Core Components/Risk Factors/Patient Goals at Discharge (Final Review):   Goals and Risk Factor Review - 07/12/23 0757       Core Components/Risk Factors/Patient Goals Review   Personal Goals Review Weight Management/Obesity;Diabetes    Review Uyen is still working on losing weight and eating better. She is making good changes and her Blood sugars have been better controlled since. She has not lost weight since making the changes. Encouraged her  to continue to eat healthy, exercise and give wieght loss goals some time to progress.    Expected Outcomes STG: Lose 5% CBW (lose 9lbs, STG goal weight of 169lbs). LTG: achieve and maintain a healthy weight             ITP Comments:  ITP Comments     Row Name 04/20/23 1529 04/21/23 0956 04/26/23 0750 05/05/23 0854 06/02/23 0738   ITP Comments Initial phone call completed. Diagnosis can be found in [facility] 2/21. EP Orientation scheduled for Wednesday 2/26 at 8am. Completed and gym orientation. Initial ITP created and sent for review to Dr. Firman Hughes, Medical Director. First full day of exercise!  Patient was oriented to gym and equipment including functions, settings, policies, and procedures.  Patient's individual exercise prescription and treatment plan were reviewed.  All starting workloads were established based on the results of the 6 minute walk test done at initial orientation visit.  The plan for exercise progression was also introduced and progression will be customized based on patient's performance and goals. 30 Day review completed. Medical Director ITP review done, changes made as directed, and signed approval by Medical Director. new to program 30 Day review completed. Medical Director ITP review done, changes made as directed, and signed approval by Medical Director.    Row Name 06/30/23 1255 07/28/23 0920         ITP Comments 30 Day review completed. Medical Director ITP review done, changes made as directed, and signed approval by Medical Director. 30 Day review completed. Medical Director ITP review done, changes made as directed, and signed approval by Medical Director.               Comments: last visit 5/19

## 2023-07-30 ENCOUNTER — Ambulatory Visit

## 2023-08-02 ENCOUNTER — Ambulatory Visit

## 2023-08-04 ENCOUNTER — Ambulatory Visit

## 2023-08-05 ENCOUNTER — Ambulatory Visit

## 2023-08-06 ENCOUNTER — Ambulatory Visit

## 2023-08-09 ENCOUNTER — Encounter: Payer: Self-pay | Admitting: *Deleted

## 2023-08-09 ENCOUNTER — Ambulatory Visit

## 2023-08-09 DIAGNOSIS — Z955 Presence of coronary angioplasty implant and graft: Secondary | ICD-10-CM

## 2023-08-09 DIAGNOSIS — I213 ST elevation (STEMI) myocardial infarction of unspecified site: Secondary | ICD-10-CM

## 2023-08-09 NOTE — Progress Notes (Signed)
 Cardiac Individual Treatment Plan  Patient Details  Name: Erica Ayala MRN: [id number] Date of Birth: 1965/04/24 Referring Provider:   Flowsheet Row Cardiac Rehab from 04/21/2023 in [facility] Cardiac and Pulmonary Rehab  Referring Provider Isabell Manzanilla, DO    Initial Encounter Date:  Flowsheet Row Cardiac Rehab from 04/21/2023 in [facility] Cardiac and Pulmonary Rehab  Date 04/21/23    Visit Diagnosis: Status post coronary artery stent placement  ST elevation myocardial infarction (STEMI), unspecified artery (HCC)  Patient's Home Medications on Admission:  Current Outpatient Medications:    albuterol (PROVENTIL HFA;VENTOLIN HFA) 108 (90 Base) MCG/ACT inhaler, Inhale 2 puffs into the lungs every 4 (four) hours as needed for wheezing or shortness of breath. (Patient not taking: Reported on 06/18/2023), Disp: , Rfl:    aspirin EC 81 MG tablet, Take 81 mg by mouth daily., Disp: , Rfl:    atorvastatin (LIPITOR) 80 MG tablet, Take 80 mg by mouth daily., Disp: , Rfl:    b complex vitamins capsule, Take 1 capsule by mouth daily., Disp: , Rfl:    Barberry-Oreg Grape-Goldenseal (BERBERINE COMPLEX PO), Take 1 capsule by mouth 3 (three) times daily., Disp: , Rfl:    canagliflozin (INVOKANA) 100 MG TABS tablet, Take 100 mg by mouth daily before breakfast., Disp: , Rfl:    cetirizine (ZYRTEC) 10 MG tablet, Take 10 mg by mouth daily., Disp: , Rfl:    Cholecalciferol (D 1000) 25 MCG (1000 UT) capsule, Take 1,000 Units by mouth daily., Disp: , Rfl:    Coenzyme Q10 (COQ10) 100 MG CAPS, Take 100 mg by mouth daily., Disp: , Rfl:    DULoxetine (CYMBALTA) 60 MG capsule, Take 60 mg by mouth daily., Disp: , Rfl:    Estradiol 10 MCG TABS vaginal tablet, Place 10 mcg vaginally 2 (two) times a week., Disp: , Rfl:    fluconazole (DIFLUCAN) 150 MG tablet, Take 150 mg by mouth once a week., Disp: , Rfl:    glipiZIDE (GLUCOTROL XL) 10 MG 24 hr tablet, Take 10 mg by mouth daily with breakfast., Disp: , Rfl:    metoprolol  succinate (TOPROL-XL) 25 MG 24 hr tablet, Take 25 mg by mouth daily., Disp: , Rfl:    Misc Natural Products (BLOOD SUGAR BALANCE PO), Take 1 capsule by mouth 3 (three) times daily. Blood Sugar Harmony, Disp: , Rfl:    Misc Natural Products (COLON CLEANSE PO), Take 2 capsules by mouth at bedtime. Plexus Bio Cleanse 60, Disp: , Rfl:    Multiple Vitamin (MULTIVITAMIN) tablet, Take 1 tablet by mouth daily., Disp: , Rfl:    nitroGLYCERIN (NITROSTAT) 0.4 MG SL tablet, Place 0.4 mg under the tongue every 5 (five) minutes as needed for chest pain., Disp: , Rfl:    Probiotic Product (PROBIOTIC DAILY PO), Take 2 capsules by mouth at bedtime. Plexus Pro Bio 5, Disp: , Rfl:    progesterone (PROMETRIUM) 100 MG capsule, Take 100 mg by mouth daily., Disp: , Rfl:    ticagrelor (BRILINTA) 90 MG TABS tablet, Take 90 mg by mouth 2 (two) times daily., Disp: , Rfl:    urea (CARMOL) 40 % CREA, Apply 1 Application topically 2 (two) times daily., Disp: , Rfl:   Past Medical History: Past Medical History:  Diagnosis Date   Acute ST elevation myocardial infarction (STEMI) of inferior wall (HCC) 04/02/2023   a.) LHC/PCI 04/02/2023: 100% m-dRCA  --> coronary thrombectomy + 3.0 x 33 mm Xience DES   CAD (coronary artery disease)    Carpal tunnel syndrome  Cervical spondylosis    Factor 5 Leiden mutation, heterozygous (HCC)    GERD (gastroesophageal reflux disease)    Hypertension    Long term current use of aspirin    Long term current use of ticagrelor therapy    Migraines    Postmenopausal bleeding    T2DM (type 2 diabetes mellitus) (HCC)    Vertigo     Tobacco Use: Social History   Tobacco Use  Smoking Status Never  Smokeless Tobacco Never    Labs: Review Flowsheet        No data to display           Exercise Target Goals: Exercise Program Goal: Individual exercise prescription set using results from initial 6 min walk test and THRR while considering  patient's activity barriers and safety.    Exercise Prescription Goal: Initial exercise prescription builds to 30-45 minutes a day of aerobic activity, 2-3 days per week.  Home exercise guidelines will be given to patient during program as part of exercise prescription that the participant will acknowledge.   Education: Aerobic Exercise: - Group verbal and visual presentation on the components of exercise prescription. Introduces F.I.T.T principle from ACSM for exercise prescriptions.  Reviews F.I.T.T. principles of aerobic exercise including progression. Written material given at graduation.   Education: Resistance Exercise: - Group verbal and visual presentation on the components of exercise prescription. Introduces F.I.T.T principle from ACSM for exercise prescriptions  Reviews F.I.T.T. principles of resistance exercise including progression. Written material given at graduation.    Education: Exercise & Equipment Safety: - Individual verbal instruction and demonstration of equipment use and safety with use of the equipment. Flowsheet Row Cardiac Rehab from 05/05/2023 in [facility] Cardiac and Pulmonary Rehab  Date 04/21/23  Educator NT  Instruction Review Code 1- Verbalizes Understanding    Education: Exercise Physiology & General Exercise Guidelines: - Group verbal and written instruction with models to review the exercise physiology of the cardiovascular system and associated critical values. Provides general exercise guidelines with specific guidelines to those with heart or lung disease.    Education: Flexibility, Balance, Mind/Body Relaxation: - Group verbal and visual presentation with interactive activity on the components of exercise prescription. Introduces F.I.T.T principle from ACSM for exercise prescriptions. Reviews F.I.T.T. principles of flexibility and balance exercise training including progression. Also discusses the mind body connection.  Reviews various relaxation techniques to help reduce and manage stress (i.e.  Deep breathing, progressive muscle relaxation, and visualization). Balance handout provided to take home. Written material given at graduation. Flowsheet Row Cardiac Rehab from 05/05/2023 in [facility] Cardiac and Pulmonary Rehab  Date 05/05/23  Educator NT  Instruction Review Code 1- Verbalizes Understanding    Activity Barriers & Risk Stratification:  Activity Barriers & Cardiac Risk Stratification - 04/20/23 1503       Activity Barriers & Cardiac Risk Stratification   Activity Barriers Back Problems;Other (comment)    Comments neuropathy    Cardiac Risk Stratification Moderate          6 Minute Walk:  6 Minute Walk     Row Name 04/21/23 1001         6 Minute Walk   Phase Initial     Distance 1240 feet     Walk Time 6 minutes     # of Rest Breaks 0     MPH 2.35     METS 3.43     RPE 9     Perceived Dyspnea  0     VO2  Peak 12.01     Symptoms Yes (comment)     Comments Feet tingling     Resting HR 71 bpm     Resting BP 112/64     Resting Oxygen Saturation  97 %     Exercise Oxygen Saturation  during 6 min walk 99 %     Max Ex. HR 93 bpm     Max Ex. BP 128/66     2 Minute Post BP 116/64        Oxygen Initial Assessment:   Oxygen Re-Evaluation:   Oxygen Discharge (Final Oxygen Re-Evaluation):   Initial Exercise Prescription:  Initial Exercise Prescription - 04/21/23 1000       Date of Initial Exercise RX and Referring Provider   Date 04/21/23    Referring Provider Lanell Pinta Custovic, DO      Oxygen   Maintain Oxygen Saturation 88% or higher      Treadmill   MPH 2.3    Grade 1    Minutes 15    METs 3.08      NuStep   Level 3    SPM 80    Minutes 15    METs 3.43      REL-XR   Level 3    Speed 50    Minutes 15    METs 3.43      Prescription Details   Frequency (times per week) 3    Duration Progress to 30 minutes of continuous aerobic without signs/symptoms of physical distress      Intensity   THRR 40-80% of Max Heartrate 108-146     Ratings of Perceived Exertion 11-13    Perceived Dyspnea 0-4      Progression   Progression Continue to progress workloads to maintain intensity without signs/symptoms of physical distress.      Resistance Training   Training Prescription Yes    Weight 4 lb    Reps 10-15          Perform Capillary Blood Glucose checks as needed.  Exercise Prescription Changes:   Exercise Prescription Changes     Row Name 04/21/23 1000 05/10/23 1500 05/26/23 1100 06/10/23 0800 06/23/23 0700     Response to Exercise   Blood Pressure (Admit) 112/64 126/66 118/64 128/70 --   Blood Pressure (Exercise) 128/66 148/74 158/82 138/60 --   Blood Pressure (Exit) 116/64 118/58 110/60 102/60 --   Heart Rate (Admit) 71 bpm 97 bpm 75 bpm 75 bpm --   Heart Rate (Exercise) 93 bpm 128 bpm 121 bpm 121 bpm --   Heart Rate (Exit) 76 bpm 82 bpm 83 bpm 84 bpm --   Oxygen Saturation (Admit) 97 % -- -- -- --   Oxygen Saturation (Exercise) 99 % -- -- -- --   Rating of Perceived Exertion (Exercise) 9 14 14 14  --   Perceived Dyspnea (Exercise) 0 -- -- -- --   Symptoms feet tingling none none none --   Comments Results First two weeks of exercise -- -- --   Duration -- Continue with 30 min of aerobic exercise without signs/symptoms of physical distress. Continue with 30 min of aerobic exercise without signs/symptoms of physical distress. Continue with 30 min of aerobic exercise without signs/symptoms of physical distress. --   Intensity -- THRR unchanged THRR unchanged THRR unchanged --     Progression   Progression -- Continue to progress workloads to maintain intensity without signs/symptoms of physical distress. Continue to progress workloads to maintain intensity without signs/symptoms  of physical distress. Continue to progress workloads to maintain intensity without signs/symptoms of physical distress. --   Average METs -- 3.17 3.56 3.65 --     Resistance Training   Training Prescription -- Yes Yes Yes --    Weight -- 4 lb 4 lb 5 lb --   Reps -- 10-15 10-15 10-15 --     Interval Training   Interval Training -- No No No --     Treadmill   MPH -- 3 2.9 3 --   Grade -- 2.5 2.5 2.5 --   Minutes -- 15 15 15  --   METs -- 4.33 4.22 4.33 --     NuStep   Level -- 5  T6 5  T6 5 --   Minutes -- 15 15 15  --   METs -- 2.7 2.3 2.6 --     REL-XR   Level -- 4 5 6  --   Minutes -- 15 15 15  --   METs -- 3.8 4 4.2 --     T5 Nustep   Level -- -- -- 7  T6 --   SPM -- -- -- 80 --   Minutes -- -- -- 15 --   METs -- -- -- 2.3 --     Home Exercise Plan   Plans to continue exercise at -- -- -- -- Home (comment)   Frequency -- -- -- -- Add 2 additional days to program exercise sessions.   Initial Home Exercises Provided -- -- -- -- 06/23/23     Oxygen   Maintain Oxygen Saturation -- 88% or higher 88% or higher 88% or higher 88% or higher    Row Name 06/24/23 0900 07/08/23 1500 07/20/23 1300         Response to Exercise   Blood Pressure (Admit) 118/64 104/60 128/82     Blood Pressure (Exit) 104/62 128/78 104/62     Heart Rate (Admit) 67 bpm 76 bpm 73 bpm     Heart Rate (Exercise) 140 bpm 108 bpm 110 bpm     Heart Rate (Exit) 77 bpm 71 bpm 78 bpm     Oxygen Saturation (Admit) 95 % -- --     Oxygen Saturation (Exercise) 93 % -- --     Oxygen Saturation (Exit) 98 % -- --     Rating of Perceived Exertion (Exercise) 17 12 12      Symptoms none none none     Duration Continue with 30 min of aerobic exercise without signs/symptoms of physical distress. Continue with 30 min of aerobic exercise without signs/symptoms of physical distress. Continue with 30 min of aerobic exercise without signs/symptoms of physical distress.     Intensity THRR unchanged THRR unchanged THRR unchanged       Progression   Progression Continue to progress workloads to maintain intensity without signs/symptoms of physical distress. Continue to progress workloads to maintain intensity without signs/symptoms of physical distress.  Continue to progress workloads to maintain intensity without signs/symptoms of physical distress.     Average METs 3.44 4.78 3.87       Resistance Training   Training Prescription Yes Yes Yes     Weight 5 lb 5 lb 5 lb     Reps 10-15 10-15 10-15       Interval Training   Interval Training No No No       Treadmill   MPH 3.2 3 3      Grade 2.5 3 3      Minutes 15 15  15     METs 4.55 4.54 4.54       NuStep   Level 6  T6 -- 8  T6     Minutes 15 -- 15     METs 2.6 -- 3.2       REL-XR   Level -- 7 --     Minutes -- 15 --     METs -- 5.8 --       T5 Nustep   Level 6 -- 7     Minutes 15 -- 15     METs 2.6 -- 3.2       Home Exercise Plan   Plans to continue exercise at Home (comment) Home (comment) Home (comment)     Frequency Add 2 additional days to program exercise sessions. Add 2 additional days to program exercise sessions. Add 2 additional days to program exercise sessions.     Initial Home Exercises Provided 06/23/23 06/23/23 06/23/23       Oxygen   Maintain Oxygen Saturation 88% or higher 88% or higher 88% or higher        Exercise Comments:   Exercise Comments     Row Name 04/26/23 0751           Exercise Comments First full day of exercise!  Patient was oriented to gym and equipment including functions, settings, policies, and procedures.  Patient's individual exercise prescription and treatment plan were reviewed.  All starting workloads were established based on the results of the 6 minute walk test done at initial orientation visit.  The plan for exercise progression was also introduced and progression will be customized based on patient's performance and goals.          Exercise Goals and Review:   Exercise Goals     Row Name 04/21/23 1000             Exercise Goals   Increase Physical Activity Yes       Intervention Provide advice, education, support and counseling about physical activity/exercise needs.;Develop an individualized exercise  prescription for aerobic and resistive training based on initial evaluation findings, risk stratification, comorbidities and participant's personal goals.       Expected Outcomes Short Term: Attend rehab on a regular basis to increase amount of physical activity.;Long Term: Add in home exercise to make exercise part of routine and to increase amount of physical activity.;Long Term: Exercising regularly at least 3-5 days a week.       Increase Strength and Stamina Yes       Intervention Provide advice, education, support and counseling about physical activity/exercise needs.;Develop an individualized exercise prescription for aerobic and resistive training based on initial evaluation findings, risk stratification, comorbidities and participant's personal goals.       Expected Outcomes Short Term: Increase workloads from initial exercise prescription for resistance, speed, and METs.;Long Term: Improve cardiorespiratory fitness, muscular endurance and strength as measured by increased METs and functional capacity ( );Short Term: Perform resistance training exercises routinely during rehab and add in resistance training at home       Able to understand and use rate of perceived exertion (RPE) scale Yes       Intervention Provide education and explanation on how to use RPE scale       Expected Outcomes Short Term: Able to use RPE daily in rehab to express subjective intensity level;Long Term:  Able to use RPE to guide intensity level when exercising independently       Able to  understand and use Dyspnea scale Yes       Intervention Provide education and explanation on how to use Dyspnea scale       Expected Outcomes Short Term: Able to use Dyspnea scale daily in rehab to express subjective sense of shortness of breath during exertion;Long Term: Able to use Dyspnea scale to guide intensity level when exercising independently       Knowledge and understanding of Target Heart Rate Range (THRR) Yes        Intervention Provide education and explanation of THRR including how the numbers were predicted and where they are located for reference       Expected Outcomes Short Term: Able to state/look up THRR;Long Term: Able to use THRR to govern intensity when exercising independently;Short Term: Able to use daily as guideline for intensity in rehab       Able to check pulse independently Yes       Intervention Review the importance of being able to check your own pulse for safety during independent exercise;Provide education and demonstration on how to check pulse in carotid and radial arteries.       Expected Outcomes Short Term: Able to explain why pulse checking is important during independent exercise;Long Term: Able to check pulse independently and accurately       Understanding of Exercise Prescription Yes       Intervention Provide education, explanation, and written materials on patient's individual exercise prescription       Expected Outcomes Long Term: Able to explain home exercise prescription to exercise independently;Short Term: Able to explain program exercise prescription          Exercise Goals Re-Evaluation :  Exercise Goals Re-Evaluation     Row Name 04/26/23 0751 05/10/23 1544 05/17/23 0759 05/24/23 0805 05/26/23 1142     Exercise Goal Re-Evaluation   Exercise Goals Review Increase Physical Activity;Able to understand and use rate of perceived exertion (RPE) scale;Knowledge and understanding of Target Heart Rate Range (THRR);Understanding of Exercise Prescription;Increase Strength and Stamina;Able to check pulse independently Increase Physical Activity;Understanding of Exercise Prescription;Increase Strength and Stamina Increase Physical Activity;Increase Strength and Stamina;Understanding of Exercise Prescription Increase Physical Activity;Understanding of Exercise Prescription;Increase Strength and Stamina Increase Physical Activity;Understanding of Exercise Prescription;Increase  Strength and Stamina   Comments Reviewed RPE and dyspnea scale, THR and program prescription with pt today.  Pt voiced understanding and was given a copy of goals to take home. Latisia is off to a good start in the program. She completed her first 2 weeks of exercise in this review. She has tolerated her exercise program well. She already increased her workloads. She increased her workload on the treadmill to a speed of 3 mph and incline of 2.5%. She also increased to level 5 on the T6 nustep and level 4 on the XR. We will continue to monitor her progress in the program. Siara is doing well here at rehab, she reports seeing some improvement in strength and stamina. She is walking at home on nice days. She is doing some stretches too. Encouraged her to include some hand weights or resistance bands. 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 Marchel stated that her husband just bought an elliptical and was interested in using it at home. She has never used on before and she was encouraged to try the EL in cardiac rehab class first to see how she tolerated it and slowing build up her endurance  on a more challenging machine before using it at home. Jaynell is doing well in rehab. She was able to increase to level 5 on the XR. She was also able to maintain her relative workload on the treadmill, and the T6 nustep. We will continue to monitor her progress in the program.   Expected Outcomes Short: Use RPE daily to regulate intensity.  Long: Follow program prescription in THR. Short: Continue to follow current exercise prescription. Long: Continue exercise to improve strength and stamina. STG: continue to attend cardiac rehab and include more weights and resistance bands during stretchs. LTG: Continue to exercise to improve strength and stamina Short: try to use the EL in class to build up to 20 min. Long: be able to use EL at home for home exercise. Short: Continue to increase  treadmill workload. Long: Continue exercise to improve strength and stamina.    Row Name 06/10/23 4098 06/16/23 0808 06/23/23 0751 06/24/23 0937 07/08/23 1554     Exercise Goal Re-Evaluation   Exercise Goals Review Increase Physical Activity;Understanding of Exercise Prescription;Increase Strength and Stamina Increase Physical Activity;Increase Strength and Stamina;Understanding of Exercise Prescription Increase Physical Activity;Increase Strength and Stamina;Understanding of Exercise Prescription;Knowledge and understanding of Target Heart Rate Range (THRR);Able to understand and use Dyspnea scale;Able to understand and use rate of perceived exertion (RPE) scale;Able to check pulse independently Increase Strength and Stamina;Increase Physical Activity;Understanding of Exercise Prescription Increase Strength and Stamina;Increase Physical Activity;Understanding of Exercise Prescription   Comments Kerianna is doing well in rehab. She increased to level 6 on the XR and level 7 on the T6 nustep. She was also able to increase her speed on the treadmill to 3 mph with 2.5% incline. She also increased to 5 lb handweights for resistance. We will continue to monitor her progress in the program. Shakerria is doing well here at rehab. She has been on the T6, treadmill and did some on the elliptical but found it challenging. She has one at home and would like to start working on improving at it. Encouraged her to continue to work with it here at rehab. She is doing hand weights, stretches and walks at home as exercise. Reviewed home exercise with pt today.  Pt plans to walk at the area around her home and use her personal elliptical and stationary bike for exercise.  Reviewed THR, pulse, RPE, sign and symptoms, pulse oximetery and when to call 911 or MD.  Also discussed weather considerations and indoor options.  Pt voiced understanding. Twania is doing well in rehab. She was able to increase her speed on the treadmill from 3  to 3.2 mph. She was also able to increase from level5 to 6 on the T6 nustep. We will continue to monitor her progress in the program. Katy is doing well in rehab. She was able to increase her incline on the treadmill from 2.5% to 3% while maintaining her speed at 3 mph. She was also able to improve to level 7 on the XR. We will continue to monitor her progress in the program.   Expected Outcomes Short: Continue to progressively increase treadmill, XR , and nustep workloads. Long: Continue exercise to improve strength and stamina. STG: Increase workload on treadmill, XR, and elliptical. LTG Continue exercise to improve strength and stamina. Short: Begin exercising at home on days away from rehab. Long: Continue to exercise independently. Short: Continue to increase treadmill workload. Long: Continue exercise to improve strength and stamina. Short: Continue to progressively increase treadmill workload. Long: Continue  exercise to improve strength and stamina.    Row Name 07/12/23 0751 07/20/23 1401 08/03/23 1458         Exercise Goal Re-Evaluation   Exercise Goals Review Increase Physical Activity;Increase Strength and Stamina;Understanding of Exercise Prescription Increase Physical Activity;Increase Strength and Stamina;Understanding of Exercise Prescription Increase Physical Activity;Increase Strength and Stamina;Understanding of Exercise Prescription     Comments Kyliyah is doing well at rehab. She is walking at home for exercise when she has time. She is doing hand weight exercises and stretches. She has a eliptical, but struggles to get on it consistently, saying it tires her out quickly. She wants to get on it more to see if she can do more time on it before getting tired. Analayah continues to do well in rehab. She continues to walk at 3 mph with a 3% incline on the treadmill. She also improved to level 7 on the T5 nustep and level 8 on the T6 nustep. We will continue to monitor her progress in the  program. Sira has not attended rehab since the last review. She has called to inform us  that she was absent from class due to personal reasons. We will continue to monitor her progress when she returns to the program.     Expected Outcomes STG: Continue to increase workload at rehab, get on eliptical more at home. LTG: Continue exercise to improve strength and stamina. Short: Continue to progressively increase treadmill workload. Long: Continue exercise to improve strength and stamina. Short: Return to rehab when appropriate. Long: Graduate from the program.        Discharge Exercise Prescription (Final Exercise Prescription Changes):  Exercise Prescription Changes - 07/20/23 1300       Response to Exercise   Blood Pressure (Admit) 128/82    Blood Pressure (Exit) 104/62    Heart Rate (Admit) 73 bpm    Heart Rate (Exercise) 110 bpm    Heart Rate (Exit) 78 bpm    Rating of Perceived Exertion (Exercise) 12    Symptoms none    Duration Continue with 30 min of aerobic exercise without signs/symptoms of physical distress.    Intensity THRR unchanged      Progression   Progression Continue to progress workloads to maintain intensity without signs/symptoms of physical distress.    Average METs 3.87      Resistance Training   Training Prescription Yes    Weight 5 lb    Reps 10-15      Interval Training   Interval Training No      Treadmill   MPH 3    Grade 3    Minutes 15    METs 4.54      NuStep   Level 8   T6   Minutes 15    METs 3.2      T5 Nustep   Level 7    Minutes 15    METs 3.2      Home Exercise Plan   Plans to continue exercise at Home (comment)    Frequency Add 2 additional days to program exercise sessions.    Initial Home Exercises Provided 06/23/23      Oxygen   Maintain Oxygen Saturation 88% or higher          Nutrition:  Target Goals: Understanding of nutrition guidelines, daily intake of sodium 1500mg , cholesterol 200mg , calories 30% from fat  and 7% or less from saturated fats, daily to have 5 or more servings of fruits and vegetables.  Education:  All About Nutrition: -Group instruction provided by verbal, written material, interactive activities, discussions, models, and posters to present general guidelines for heart healthy nutrition including fat, fiber, MyPlate, the role of sodium in heart healthy nutrition, utilization of the nutrition label, and utilization of this knowledge for meal planning. Follow up email sent as well. Written material given at graduation. Flowsheet Row Cardiac Rehab from 05/05/2023 in [facility] Cardiac and Pulmonary Rehab  Education need identified 04/21/23    Biometrics:  Pre Biometrics - 04/21/23 1001       Pre Biometrics   Height 5' 4.75 (1.645 m)    Weight 169 lb 8 oz (76.9 kg)    Waist Circumference 37 inches    Hip Circumference 39 inches    Waist to Hip Ratio 0.95 %    BMI (Calculated) 28.41    Single Leg Stand 5.2 seconds           Nutrition Therapy Plan and Nutrition Goals:  Nutrition Therapy & Goals - 04/21/23 1104       Nutrition Therapy   Diet Carb controlled    Protein (specify units) 90    Fiber 25 grams    Whole Grain Foods 3 servings    Saturated Fats 15 max. grams    Fruits and Vegetables 5 servings/day    Sodium 2 grams      Personal Nutrition Goals   Nutrition Goal Eat 15-30gProtein and 30-60gCarbs at each meal.    Personal Goal #2 Read labels and reduce sodium intake to below 2300mg . Ideally 1500mg  per day.    Personal Goal #3 Reduce saturated fat, less than 12g per day. Replace bad fats for more heart healthy fats.    Comments Patient is not drinking sugary beverages. She and her mother have DM, so she is carb conscious. Tries to pick complex carbs and keep them controlled. She was not aware of how many carbs to eat at each meal. Educated on carb goal of 30-60g per meal, with snacks roughly half as much. Her A1C was 11% 2 weeks ago, which was alarming. Spoke with her,  she reports her life became stressful with several family members in and out of the hospital. She also reports her Dr placed her on a different medication for her DM. Encouraged her to communicate with Dr about her blood sugars, and to continue to make good carb choices. Educated on [employer], types of fats, sources, and how to read labels. Went over sodium and its effects on blood pressure and heart health. Build out several small meals and snacks that are easier to make for when life gets suddenly busy.      Intervention Plan   Intervention Prescribe, educate and counsel regarding individualized specific dietary modifications aiming towards targeted core components such as weight, hypertension, lipid management, diabetes, heart failure and other comorbidities.;Nutrition handout(s) given to patient.    Expected Outcomes Short Term Goal: Understand basic principles of dietary content, such as calories, fat, sodium, cholesterol and nutrients.;Short Term Goal: A plan has been developed with personal nutrition goals set during dietitian appointment.;Long Term Goal: Adherence to prescribed nutrition plan.          Nutrition Assessments:  MEDIFICTS Score Key: >=70 Need to make dietary changes  40-70 Heart Healthy Diet <= 40 Therapeutic Level Cholesterol Diet  Flowsheet Row Cardiac Rehab from 04/21/2023 in [facility] Cardiac and Pulmonary Rehab  Picture Your Plate Total Score on Admission 68   Picture Your Plate Scores: <16 Unhealthy dietary pattern with  much room for improvement. 41-50 Dietary pattern unlikely to meet recommendations for good health and room for improvement. 51-60 More healthful dietary pattern, with some room for improvement.  >60 Healthy dietary pattern, although there may be some specific behaviors that could be improved.    Nutrition Goals Re-Evaluation:  Nutrition Goals Re-Evaluation     Row Name 05/17/23 0810 06/16/23 0813 07/12/23 0756         Goals   Comment  Chinenye reports she is doing better with controlling her BS. not every day is a good day, but says she has more good days than bad as of late. Encouraged her to continue check her BS, eat contorlled portions of carbs and comminucate with Dr about her meds and dosages. Juno reports she is packing healthier lunches with more veggies and fruits to take to work. Says he blood sugars have been okay but wants to talk with her Dr about medication adjustments. Malessa says she is still packing healthier lunches to work. Says she spoke with her Dr about medication adjustments and her blood sugars have been much better.     Expected Outcome STG: Check BS and communicate with doctor. LTG: Manage BS independently and bring A1C down to below 7% STG: Check BS, continue to pack healthier lunches. LTG Manage BS independently and bring A1C down to below 7% STG: continue to pack healthier lunches. LTG: Manage BS independently and bring A1C down to below 7%        Nutrition Goals Discharge (Final Nutrition Goals Re-Evaluation):  Nutrition Goals Re-Evaluation - 07/12/23 0756       Goals   Comment Thurley says she is still packing healthier lunches to work. Says she spoke with her Dr about medication adjustments and her blood sugars have been much better.    Expected Outcome STG: continue to pack healthier lunches. LTG: Manage BS independently and bring A1C down to below 7%          Psychosocial: Target Goals: Acknowledge presence or absence of significant depression and/or stress, maximize coping skills, provide positive support system. Participant is able to verbalize types and ability to use techniques and skills needed for reducing stress and depression.   Education: Stress, Anxiety, and Depression - Group verbal and visual presentation to define topics covered.  Reviews how body is impacted by stress, anxiety, and depression.  Also discusses healthy ways to reduce stress and to treat/manage anxiety and  depression.  Written material given at graduation.   Education: Sleep Hygiene -Provides group verbal and written instruction about how sleep can affect your health.  Define sleep hygiene, discuss sleep cycles and impact of sleep habits. Review good sleep hygiene tips.    Initial Review & Psychosocial Screening:  Initial Psych Review & Screening - 04/20/23 1511       Initial Review   Current issues with Current Sleep Concerns;Current Stress Concerns    Source of Stress Concerns Family      Family Dynamics   Good Support System? Yes   husband, family     Barriers   Psychosocial barriers to participate in program There are no identifiable barriers or psychosocial needs.;The patient should benefit from training in stress management and relaxation.      Screening Interventions   Interventions Encouraged to exercise;Provide feedback about the scores to participant;To provide support and resources with identified psychosocial needs    Expected Outcomes Short Term goal: Utilizing psychosocial counselor, staff and physician to assist with identification of specific Stressors or  current issues interfering with healing process. Setting desired goal for each stressor or current issue identified.;Long Term Goal: Stressors or current issues are controlled or eliminated.;Short Term goal: Identification and review with participant of any Quality of Life or Depression concerns found by scoring the questionnaire.;Long Term goal: The participant improves quality of Life and PHQ9 Scores as seen by post scores and/or verbalization of changes          Quality of Life Scores:   Quality of Life - 04/21/23 0958       Quality of Life   Select Quality of Life      Quality of Life Scores   Health/Function Pre 16.4 %    Socioeconomic Pre 23.63 %    Psych/Spiritual Pre 28.29 %    Family Pre 25.2 %    GLOBAL Pre 21.69 %         Scores of 19 and below usually indicate a poorer quality of life in these  areas.  A difference of  2-3 points is a clinically meaningful difference.  A difference of 2-3 points in the total score of the Quality of Life Index has been associated with significant improvement in overall quality of life, self-image, physical symptoms, and general health in studies assessing change in quality of life.  PHQ-9: Review Flowsheet       05/17/2023 04/21/2023 02/27/2016  Depression screen PHQ 2/9  Decreased Interest 0 1 0  Down, Depressed, Hopeless 1 1 1   PHQ - 2 Score 1 2 1   Altered sleeping 1 2 -  Tired, decreased energy 1 3 -  Change in appetite 0 0 -  Feeling bad or failure about yourself  0 0 -  Trouble concentrating 0 1 -  Moving slowly or fidgety/restless 0 0 -  Suicidal thoughts 0 0 -  PHQ-9 Score 3 8 -  Difficult doing work/chores Not difficult at all Somewhat difficult -   Interpretation of Total Score  Total Score Depression Severity:  1-4 = Minimal depression, 5-9 = Mild depression, 10-14 = Moderate depression, 15-19 = Moderately severe depression, 20-27 = Severe depression   Psychosocial Evaluation and Intervention:  Psychosocial Evaluation - 04/20/23 1521       Psychosocial Evaluation & Interventions   Interventions Encouraged to exercise with the program and follow exercise prescription;Stress management education;Relaxation education    Comments 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 She had her MI while visiting her hospitalized father. She is helping care for her dad and her mother. Her husband and her also help care for her mother in law who had a stroke soon after Elaynah's father-in-law passed away in 12-25-2023. In addition to her parents and in law stress, she also mentions she helps with her husband who is a 100% disabled veteran who struggles with PTSD. 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 Today they also had to put down their family dog. She admits that  this all has been a tremendous amount of stress for her and she knows she has to take care of herself. Her duloxetine is helping and she is trying lifestyle changes as well.  She is looking forward to the classes and developing a consistent exercise routine. She is very thankful for her support system and ability to recover.    Expected Outcomes Short: attend cardiac rehab for education and exercise. Long: develop and maintain positive self care habits  Continue Psychosocial Services  Follow up required by staff          Psychosocial Re-Evaluation:  Psychosocial Re-Evaluation     Row Name 05/17/23 0804 06/16/23 0811 07/12/23 0753         Psychosocial Re-Evaluation   Current issues with Current Sleep Concerns Current Sleep Concerns Current Stress Concerns     Comments Mehlani reports she is doing well with mental health. She does experience stress from fathers health, school, and social life. But she has good coping mechanisms with hobbies like piano, cross stitching and walking. She reports sleep difficulites on some nights, spoke with her Dr and was prescribed melatonin, just started it, will continue to monitor. Feliz denies any anxeity, depression, or stress. Says she is dealing with caring for parents during their health declines but will have more free time to care for them in a few months. She is not sleeping well, has been working with Dr to find a medication to help Trianna denies any anxiety or depression. She says she is caring for her parents as their health declines which is causing a lot of stress. She reports she has a good support system with family and friends. She says she has been sleeping better, ~8-9hrs a night.     Expected Outcomes STG: try melotonin and communicate with Dr about effectiveness. LTG: Maintain positive outlook on health and daily life STG: Practice good sleep hygiene. LTG: Maintain positive outlook on health and daily life STG: Continue to focus on good sleep.  LTG: Maintain positive outlook on health and daily life     Interventions Encouraged to attend Cardiac Rehabilitation for the exercise Encouraged to attend Cardiac Rehabilitation for the exercise Encouraged to attend Cardiac Rehabilitation for the exercise     Continue Psychosocial Services  Follow up required by staff Follow up required by staff Follow up required by staff       Initial Review   Source of Stress Concerns -- -- Family        Psychosocial Discharge (Final Psychosocial Re-Evaluation):  Psychosocial Re-Evaluation - 07/12/23 0753       Psychosocial Re-Evaluation   Current issues with Current Stress Concerns    Comments Stpehanie denies any anxiety or depression. She says she is caring for her parents as their health declines which is causing a lot of stress. She reports she has a good support system with family and friends. She says she has been sleeping better, ~8-9hrs a night.    Expected Outcomes STG: Continue to focus on good sleep. LTG: Maintain positive outlook on health and daily life    Interventions Encouraged to attend Cardiac Rehabilitation for the exercise    Continue Psychosocial Services  Follow up required by staff      Initial Review   Source of Stress Concerns Family          Vocational Rehabilitation: Provide vocational rehab assistance to qualifying candidates.   Vocational Rehab Evaluation & Intervention:  Vocational Rehab - 04/20/23 1510       Initial Vocational Rehab Evaluation & Intervention   Assessment shows need for Vocational Rehabilitation No          Education: Education Goals: Education classes will be provided on a variety of topics geared toward better understanding of heart health and risk factor modification. Participant will state understanding/return demonstration of topics presented as noted by education test scores.  Learning Barriers/Preferences:  Learning Barriers/Preferences - 04/20/23 1510       Learning  Barriers/Preferences   Learning Barriers None    Learning Preferences None          General Cardiac Education Topics:  AED/CPR: - Group verbal and written instruction with the use of models to demonstrate the basic use of the AED with the basic ABC's of resuscitation.   Anatomy and Cardiac Procedures: - Group verbal and visual presentation and models provide information about basic cardiac anatomy and function. Reviews the testing methods done to diagnose heart disease and the outcomes of the test results. Describes the treatment choices: Medical Management, Angioplasty, or Coronary Bypass Surgery for treating various heart conditions including Myocardial Infarction, Angina, Valve Disease, and Cardiac Arrhythmias.  Written material given at graduation. Flowsheet Row Cardiac Rehab from 05/05/2023 in [facility] Cardiac and Pulmonary Rehab  Education need identified 04/21/23    Medication Safety: - Group verbal and visual instruction to review commonly prescribed medications for heart and lung disease. Reviews the medication, class of the drug, and side effects. Includes the steps to properly store meds and maintain the prescription regimen.  Written material given at graduation.   Intimacy: - Group verbal instruction through game format to discuss how heart and lung disease can affect sexual intimacy. Written material given at graduation..   Know Your Numbers and Heart Failure: - Group verbal and visual instruction to discuss disease risk factors for cardiac and pulmonary disease and treatment options.  Reviews associated critical values for Overweight/Obesity, Hypertension, Cholesterol, and Diabetes.  Discusses basics of heart failure: signs/symptoms and treatments.  Introduces Heart Failure Zone chart for action plan for heart failure.  Written material given at graduation.   Infection Prevention: - Provides verbal and written material to individual with discussion of infection control  including proper hand washing and proper equipment cleaning during exercise session. Flowsheet Row Cardiac Rehab from 05/05/2023 in [facility] Cardiac and Pulmonary Rehab  Date 04/21/23  Educator NT  Instruction Review Code 1- Verbalizes Understanding    Falls Prevention: - Provides verbal and written material to individual with discussion of falls prevention and safety. Flowsheet Row Cardiac Rehab from 05/05/2023 in [facility] Cardiac and Pulmonary Rehab  Date 04/21/23  Educator NT  Instruction Review Code 1- Verbalizes Understanding    Other: -Provides group and verbal instruction on various topics (see comments)   Knowledge Questionnaire Score:  Knowledge Questionnaire Score - 04/21/23 1610       Knowledge Questionnaire Score   Pre Score 24/26          Core Components/Risk Factors/Patient Goals at Admission:  Personal Goals and Risk Factors at Admission - 04/20/23 1504       Core Components/Risk Factors/Patient Goals on Admission    Weight Management Yes;Weight Loss    Intervention Weight Management: Develop a combined nutrition and exercise program designed to reach desired caloric intake, while maintaining appropriate intake of nutrient and fiber, sodium and fats, and appropriate energy expenditure required for the weight goal.;Weight Management: Provide education and appropriate resources to help participant work on and attain dietary goals.;Weight Management/Obesity: Establish reasonable short term and long term weight goals.    Admit Weight 163 lb (73.9 kg)    Expected Outcomes Long Term: Adherence to nutrition and physical activity/exercise program aimed toward attainment of established weight goal;Short Term: Continue to assess and modify interventions until short term weight is achieved;Weight Loss: Understanding of general recommendations for a balanced deficit meal plan, which promotes 1-2 lb weight loss per week and includes a negative energy balance of [phone]  kcal/d;Understanding recommendations for  meals to include 15-35% energy as protein, 25-35% energy from fat, 35-60% energy from carbohydrates, less than 200mg  of dietary cholesterol, 20-35 gm of total fiber daily;Understanding of distribution of calorie intake throughout the day with the consumption of 4-5 meals/snacks    Diabetes Yes    Intervention Provide education about signs/symptoms and action to take for hypo/hyperglycemia.;Provide education about proper nutrition, including hydration, and aerobic/resistive exercise prescription along with prescribed medications to achieve blood glucose in normal ranges: Fasting glucose 65-99 mg/dL    Expected Outcomes Short Term: Participant verbalizes understanding of the signs/symptoms and immediate care of hyper/hypoglycemia, proper foot care and importance of medication, aerobic/resistive exercise and nutrition plan for blood glucose control.;Long Term: Attainment of HbA1C < 7%.    Hypertension Yes    Intervention Provide education on lifestyle modifcations including regular physical activity/exercise, weight management, moderate sodium restriction and increased consumption of fresh fruit, vegetables, and low fat dairy, alcohol moderation, and smoking cessation.;Monitor prescription use compliance.    Expected Outcomes Short Term: Continued assessment and intervention until BP is < 140/19mm HG in hypertensive participants. < 130/81mm HG in hypertensive participants with diabetes, heart failure or chronic kidney disease.;Long Term: Maintenance of blood pressure at goal levels.          Education:Diabetes - Individual verbal and written instruction to review signs/symptoms of diabetes, desired ranges of glucose level fasting, after meals and with exercise. Acknowledge that pre and post exercise glucose checks will be done for 3 sessions at entry of program. Flowsheet Row Cardiac Rehab from 05/05/2023 in [facility] Cardiac and Pulmonary Rehab  Date 04/20/23   Educator [facility]  Instruction Review Code 1- Verbalizes Understanding    Core Components/Risk Factors/Patient Goals Review:   Goals and Risk Factor Review     Row Name 05/17/23 0817 06/16/23 0815 07/12/23 0757         Core Components/Risk Factors/Patient Goals Review   Personal Goals Review Weight Management/Obesity;Diabetes Weight Management/Obesity;Diabetes Weight Management/Obesity;Diabetes     Review Laquinta reports she has gain 7lbs since starting the program, she says she has been eating better and exercising more. She has been reducing her carb intake most days, says her BS has been lower too.  Reminded her to focus on long term success and not to be discouraged by a poor start in her weight loss journey. Also discussed and educated about fluid retention sodium and water weight as some times she reports large shifts in weight on short time frames. Landy has gained weight since her heart event and is frustrated as she feels she has made good changes. Encouraged and supported her to stay positive and focused on what she can do to be healthy. Eating better, exercising more, getting good sleep should be her focus right now. Hadlea is still working on losing weight and eating better. She is making good changes and her Blood sugars have been better controlled since. She has not lost weight since making the changes. Encouraged her to continue to eat healthy, exercise and give wieght loss goals some time to progress.     Expected Outcomes STG: lose 5% CBW. LTG: achieve and maintain a healthy weight. STG: lose 5% CBW. LTG: achieve and maintain a healthy weight STG: Lose 5% CBW (lose 9lbs, STG goal weight of 169lbs). LTG: achieve and maintain a healthy weight        Core Components/Risk Factors/Patient Goals at Discharge (Final Review):   Goals and Risk Factor Review - 07/12/23 0757  Core Components/Risk Factors/Patient Goals Review   Personal Goals Review Weight Management/Obesity;Diabetes     Review Saiya is still working on losing weight and eating better. She is making good changes and her Blood sugars have been better controlled since. She has not lost weight since making the changes. Encouraged her to continue to eat healthy, exercise and give wieght loss goals some time to progress.    Expected Outcomes STG: Lose 5% CBW (lose 9lbs, STG goal weight of 169lbs). LTG: achieve and maintain a healthy weight          ITP Comments:  ITP Comments     Row Name 04/20/23 1529 04/21/23 0956 04/26/23 0750 05/05/23 0854 06/02/23 0738   ITP Comments Initial phone call completed. Diagnosis can be found in [facility] 2/21. EP Orientation scheduled for Wednesday 2/26 at 8am. Completed and gym orientation. Initial ITP created and sent for review to Dr. Firman Hughes, Medical Director. First full day of exercise!  Patient was oriented to gym and equipment including functions, settings, policies, and procedures.  Patient's individual exercise prescription and treatment plan were reviewed.  All starting workloads were established based on the results of the 6 minute walk test done at initial orientation visit.  The plan for exercise progression was also introduced and progression will be customized based on patient's performance and goals. 30 Day review completed. Medical Director ITP review done, changes made as directed, and signed approval by Medical Director. new to program 30 Day review completed. Medical Director ITP review done, changes made as directed, and signed approval by Medical Director.    Row Name 06/30/23 1255 07/28/23 0920 08/09/23 1422       ITP Comments 30 Day review completed. Medical Director ITP review done, changes made as directed, and signed approval by Medical Director. 30 Day review completed. Medical Director ITP review done, changes made as directed, and signed approval by Medical Director. Stevee called staff to state she needed to be discharged at this time due to insurance  concerns and recent passing of her father. She completed 24/36 sessions.        Comments: Early Discharge ITP

## 2023-08-09 NOTE — Progress Notes (Signed)
 Discharge Summary   Erica Ayala DOB: 1966/01/15    Erica Ayala called staff today requesting to be discharged early from  rehab with 24/36 sessions completed.  Erica Ayala has had insurance concerns and is dealing with the recent passing of her father. Details of the patient's exercise prescription and what Erica Ayala needs to do in order to continue the prescription and progress were discussed with patient.  Patient was given a copy of prescription and goals.  Patient verbalized understanding. Erica Ayala plans to continue to exercise by walking at home.   6 Minute Walk     Row Name 04/21/23 1001         6 Minute Walk   Phase Initial     Distance 1240 feet     Walk Time 6 minutes     # of Rest Breaks 0     MPH 2.35     METS 3.43     RPE 9     Perceived Dyspnea  0     VO2 Peak 12.01     Symptoms Yes (comment)     Comments Feet tingling     Resting HR 71 bpm     Resting BP 112/64     Resting Oxygen Saturation  97 %     Exercise Oxygen Saturation  during 6 min walk 99 %     Max Ex. HR 93 bpm     Max Ex. BP 128/66     2 Minute Post BP 116/64

## 2023-08-11 ENCOUNTER — Ambulatory Visit

## 2023-08-12 ENCOUNTER — Ambulatory Visit

## 2023-08-13 ENCOUNTER — Ambulatory Visit

## 2023-08-16 ENCOUNTER — Ambulatory Visit

## 2023-08-18 ENCOUNTER — Ambulatory Visit

## 2023-08-19 ENCOUNTER — Ambulatory Visit

## 2023-08-20 ENCOUNTER — Ambulatory Visit

## 2023-08-23 ENCOUNTER — Ambulatory Visit

## 2023-08-25 ENCOUNTER — Ambulatory Visit

## 2023-08-26 ENCOUNTER — Ambulatory Visit

## 2023-08-30 ENCOUNTER — Ambulatory Visit

## 2023-09-01 ENCOUNTER — Ambulatory Visit
